# Patient Record
Sex: Female | Born: 1955 | Race: White | Hispanic: No | Marital: Married | Smoking: Never smoker
Health system: Southern US, Community
[De-identification: ages and names within clinical notes are randomized; demographics above are authoritative.]

## PROBLEM LIST (undated history)

## (undated) DIAGNOSIS — K5792 Diverticulitis of intestine, part unspecified, without perforation or abscess without bleeding: Secondary | ICD-10-CM

## (undated) DIAGNOSIS — C52 Malignant neoplasm of vagina: Secondary | ICD-10-CM

## (undated) DIAGNOSIS — R87612 Low grade squamous intraepithelial lesion on cytologic smear of cervix (LGSIL): Secondary | ICD-10-CM

## (undated) HISTORY — PX: DILATION AND CURETTAGE OF UTERUS: SHX78

## (undated) HISTORY — PX: ABDOMINAL HYSTERECTOMY: SHX81

## (undated) HISTORY — PX: CHOLECYSTECTOMY OPEN: SUR202

## (undated) HISTORY — DX: Malignant neoplasm of vagina: C52

## (undated) HISTORY — DX: Diverticulitis of intestine, part unspecified, without perforation or abscess without bleeding: K57.92

## (undated) HISTORY — PX: VEIN SURGERY: SHX48

## (undated) HISTORY — PX: HYSTERECTOMY ABDOMINAL WITH SALPINGECTOMY: SHX6725

## (undated) HISTORY — PX: TONSILLECTOMY: SUR1361

## (undated) HISTORY — PX: COLONOSCOPY: SHX174

## (undated) HISTORY — DX: Low grade squamous intraepithelial lesion on cytologic smear of cervix (LGSIL): R87.612

---

## 2016-07-29 LAB — HM PAP SMEAR: HM Pap smear: NEGATIVE

## 2016-07-30 LAB — HM MAMMOGRAPHY

## 2017-08-11 ENCOUNTER — Encounter: Payer: Self-pay | Admitting: Obstetrics & Gynecology

## 2017-08-11 ENCOUNTER — Ambulatory Visit (INDEPENDENT_AMBULATORY_CARE_PROVIDER_SITE_OTHER): Payer: 59 | Admitting: Obstetrics & Gynecology

## 2017-08-11 VITALS — BP 130/80 | HR 79 | Ht 63.0 in | Wt 130.0 lb

## 2017-08-11 DIAGNOSIS — Z1272 Encounter for screening for malignant neoplasm of vagina: Secondary | ICD-10-CM | POA: Diagnosis not present

## 2017-08-11 DIAGNOSIS — Z1231 Encounter for screening mammogram for malignant neoplasm of breast: Secondary | ICD-10-CM | POA: Diagnosis not present

## 2017-08-11 DIAGNOSIS — Z1211 Encounter for screening for malignant neoplasm of colon: Secondary | ICD-10-CM | POA: Diagnosis not present

## 2017-08-11 DIAGNOSIS — Z01419 Encounter for gynecological examination (general) (routine) without abnormal findings: Secondary | ICD-10-CM

## 2017-08-11 DIAGNOSIS — Z1239 Encounter for other screening for malignant neoplasm of breast: Secondary | ICD-10-CM

## 2017-08-11 DIAGNOSIS — Z0000 Encounter for general adult medical examination without abnormal findings: Secondary | ICD-10-CM

## 2017-08-11 NOTE — Patient Instructions (Signed)
PAP every three years Mammogram every year    Call [place] to schedule Colonoscopy every 5 years Labs next year

## 2017-08-11 NOTE — Progress Notes (Signed)
HPI:      Amy Saunders is a 61 y.o. [id number] who LMP was in the past, she presents today for her annual examination.  The patient has no complaints today. The patient is sexually active. 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  The patient does perform self breast exams.  There is no notable family history of breast or ovarian cancer in her family. The patient is not taking hormone replacement therapy. Patient denies post-menopausal vaginal bleeding.   The patient has regular exercise: yes. The patient denies current symptoms of depression.    GYN Hx: Last Colonoscopy:3 years ago. Normal.  Last DEXA: 3 years ago.    PMHx: Past Medical History:  Diagnosis Date  . Diverticulitis   . Pap smear abnormality of cervix with LGSIL   . 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 ABDOMINAL HYSTERECTOMY    . CHOLECYSTECTOMY OPEN    . DILATION AND CURETTAGE OF UTERUS    . TONSILLECTOMY    . VEIN SURGERY     Family History  Problem Relation Age of Onset  . Heart disease Father   . Lung cancer Father    Social History   Tobacco Use  . Smoking status: Never Smoker  . Smokeless tobacco: Never Used  Substance Use Topics  . Alcohol use: No    Frequency: Never  . Drug use: No   No current outpatient medications on file. Allergies: Patient has no known allergies.  Review of Systems  Constitutional: Negative for chills, fever and malaise/fatigue.  HENT: Negative for congestion, sinus pain and sore throat.   Eyes: Negative for blurred vision and pain.  Respiratory: Negative for cough and wheezing.   Cardiovascular: Negative for chest pain and leg swelling.  Gastrointestinal: Negative for abdominal pain, constipation, diarrhea, heartburn, nausea and vomiting.  Genitourinary:  Negative for dysuria, frequency, hematuria and urgency.  Musculoskeletal: Negative for back pain, joint pain, myalgias and neck pain.  Skin: Negative for itching and rash.  Neurological: Negative for dizziness, tremors and weakness.  Endo/Heme/Allergies: Does not bruise/bleed easily.  Psychiatric/Behavioral: Negative for depression. The patient is not nervous/anxious and does not have insomnia.     Objective: BP 130/80   Pulse 79   Ht 5\' 3"  (1.6 m)   Wt 130 lb (59 kg)   BMI 23.03 kg/m   Filed Weights   08/11/17 0804  Weight: 130 lb (59 kg)   Body mass index is 23.03 kg/m. Physical Exam  Constitutional: She is oriented to person, place, and time. She appears well-developed and well-nourished. No distress.  Genitourinary: Rectum normal and vagina normal. Pelvic exam was performed with patient supine. There is no rash or lesion on the right labia. There is no rash or lesion on the left labia. Vagina exhibits no lesion. No bleeding in the vagina. Right adnexum does not display mass and does not display tenderness. Left adnexum does not display mass and does not display tenderness.  Genitourinary Comments: Absent Uterus Absent cervix Vaginal cuff well healed  HENT:  Head: Normocephalic and atraumatic. Head is without laceration.  Right Ear: Hearing normal.  Left Ear: Hearing normal.  Nose: No epistaxis.  No foreign bodies.  Mouth/Throat: Uvula is midline, oropharynx is clear and moist and mucous membranes are normal.  Eyes: Pupils are  equal, round, and reactive to light.  Neck: Normal range of motion. Neck supple. No thyromegaly present.  Cardiovascular: Normal rate and regular rhythm. Exam reveals no gallop and no friction rub.  No murmur heard. Pulmonary/Chest: Effort normal and breath sounds normal. No respiratory distress. She has no wheezes. Right breast exhibits no mass, no skin change and no tenderness. Left breast exhibits no mass, no skin change and no tenderness.  Abdominal:  Soft. Bowel sounds are normal. She exhibits no distension. There is no tenderness. There is no rebound.  Musculoskeletal: Normal range of motion.  Neurological: She is alert and oriented to person, place, and time. No cranial nerve deficit.  Skin: Skin is warm and dry.  Psychiatric: She has a normal mood and affect. Judgment normal.  Vitals reviewed.  Assessment: Annual Exam 1. Annual physical exam   2. Screening for breast cancer   3. Screening for vaginal cancer   4. Screen for colon cancer    Plan:            1.  Cervical Screening-  Pap smear done today, Pap smear schedule reviewed with patient, vaginal PAP due to h/o VAIN (last few have been normal).  2. Breast screening- Exam annually and mammogram scheduled  3. Colonoscopy every 5 years per GI due 2020, Hemoccult testing yearly  4. Labs normal last year; repeat 2019  5. Counseling for hormonal therapy: none, no change in therapy today     F/U  Return in about 1 year (around 08/11/2018) for Annual.  Amy Applebaum, MD, [facility], [facility] 08/11/2017  8:21 AM

## 2017-08-12 LAB — PAP IG (IMAGE GUIDED): PAP Smear Comment: 0

## 2017-08-19 LAB — HM MAMMOGRAPHY

## 2017-08-29 ENCOUNTER — Encounter: Payer: Self-pay | Admitting: Obstetrics & Gynecology

## 2017-09-04 LAB — FECAL OCCULT BLOOD, IMMUNOCHEMICAL: FECAL OCCULT BLD: NEGATIVE

## 2017-11-15 ENCOUNTER — Telehealth: Payer: Self-pay

## 2017-11-15 NOTE — Telephone Encounter (Signed)
Pt states she had mammo done 08/19/2017 at 8:15am across from [facility] not sure of the name. The mammogram was normal.

## 2018-02-27 DIAGNOSIS — D239 Other benign neoplasm of skin, unspecified: Secondary | ICD-10-CM

## 2018-02-27 HISTORY — DX: Other benign neoplasm of skin, unspecified: D23.9

## 2018-08-17 ENCOUNTER — Ambulatory Visit (INDEPENDENT_AMBULATORY_CARE_PROVIDER_SITE_OTHER): Payer: 59 | Admitting: Obstetrics & Gynecology

## 2018-08-17 ENCOUNTER — Encounter: Payer: Self-pay | Admitting: Obstetrics & Gynecology

## 2018-08-17 VITALS — BP 140/80 | HR 59 | Ht 63.0 in | Wt 125.0 lb

## 2018-08-17 DIAGNOSIS — N952 Postmenopausal atrophic vaginitis: Secondary | ICD-10-CM

## 2018-08-17 DIAGNOSIS — Z1239 Encounter for other screening for malignant neoplasm of breast: Secondary | ICD-10-CM

## 2018-08-17 DIAGNOSIS — Z01419 Encounter for gynecological examination (general) (routine) without abnormal findings: Secondary | ICD-10-CM | POA: Diagnosis not present

## 2018-08-17 DIAGNOSIS — Z1211 Encounter for screening for malignant neoplasm of colon: Secondary | ICD-10-CM

## 2018-08-17 DIAGNOSIS — Z0000 Encounter for general adult medical examination without abnormal findings: Secondary | ICD-10-CM

## 2018-08-17 NOTE — Progress Notes (Signed)
HPI:      Amy Saunders is a 62 y.o. [id number] who LMP was in the past (hysterectomy), she presents today for her annual examination.  The patient has no complaints today. The patient is sexually active w occas spotting ad=fter sex. Herlast pap: approximate date 2018 and was normal and has had prior VAIN years ago and last mammogram: approximate date 2018 and was normal.  The patient does perform self breast exams.  There is no notable family history of breast or ovarian cancer in her family. The patient is not taking hormone replacement therapy. Patient denies post-menopausal vaginal bleeding.   The patient has regular exercise: yes. The patient denies current symptoms of depression.    GYN Hx: Last Colonoscopy:4 years ago. Normal. Due every 5    PMHx: Past Medical History:  Diagnosis Date  . Diverticulitis   . Pap smear abnormality of cervix with LGSIL   . 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 ABDOMINAL HYSTERECTOMY    . CHOLECYSTECTOMY OPEN    . COLONOSCOPY    . DILATION AND CURETTAGE OF UTERUS    . HYSTERECTOMY ABDOMINAL WITH SALPINGECTOMY    . TONSILLECTOMY    . VEIN SURGERY     Family History  Problem Relation Age of Onset  . Heart disease Father   . Lung cancer Father    Social History   Tobacco Use  . Smoking status: Never Smoker  . Smokeless tobacco: Never Used  Substance Use Topics  . Alcohol use: No    Frequency: Never  . Drug use: No   No current outpatient medications on file. Allergies: Patient has no known allergies.  Review of Systems  Constitutional: Negative for chills, fever and malaise/fatigue.  HENT: Negative for congestion, sinus pain and sore throat.   Eyes: Negative for blurred vision and pain.  Respiratory: Negative for cough and wheezing.   Cardiovascular: Negative for chest pain and leg swelling.  Gastrointestinal: Negative for abdominal pain, constipation, diarrhea, heartburn, nausea  and vomiting.  Genitourinary: Negative for dysuria, frequency, hematuria and urgency.  Musculoskeletal: Negative for back pain, joint pain, myalgias and neck pain.  Skin: Negative for itching and rash.  Neurological: Negative for dizziness, tremors and weakness.  Endo/Heme/Allergies: Does not bruise/bleed easily.  Psychiatric/Behavioral: Negative for depression. The patient is not nervous/anxious and does not have insomnia.     Objective: BP 140/80   Pulse (!) 59   Ht 5\' 3"  (1.6 m)   Wt 125 lb (56.7 kg)   BMI 22.14 kg/m   Filed Weights   08/17/18 0914  Weight: 125 lb (56.7 kg)   Body mass index is 22.14 kg/m. Physical Exam  Constitutional: She is oriented to person, place, and time. She appears well-developed and well-nourished. No distress.  Genitourinary: Rectum normal and vagina normal. Pelvic exam was performed with patient supine. There is no rash or lesion on the right labia. There is no rash or lesion on the left labia. Vagina exhibits no lesion. No bleeding in the vagina. Right adnexum does not display mass and does not display tenderness. Left adnexum does not display mass and does not display tenderness.  Genitourinary Comments: Absent Uterus Absent cervix Vaginal cuff well healed  HENT:  Head: Normocephalic and atraumatic. Head is without laceration.  Right Ear: Hearing normal.  Left Ear: Hearing normal.  Nose: No epistaxis.  No foreign bodies.  Mouth/Throat: Uvula is midline, oropharynx is clear and moist and  mucous membranes are normal.  Eyes: Pupils are equal, round, and reactive to light.  Neck: Normal range of motion. Neck supple. No thyromegaly present.  Cardiovascular: Normal rate and regular rhythm. Exam reveals no gallop and no friction rub.  No murmur heard. Pulmonary/Chest: Effort normal and breath sounds normal. No respiratory distress. She has no wheezes. Right breast exhibits no mass, no skin change and no tenderness. Left breast exhibits no mass, no skin  change and no tenderness.  Abdominal: Soft. Bowel sounds are normal. She exhibits no distension. There is no tenderness. There is no rebound.  Musculoskeletal: Normal range of motion.  Neurological: She is alert and oriented to person, place, and time. No cranial nerve deficit.  Skin: Skin is warm and dry.  Psychiatric: She has a normal mood and affect. Judgment normal.  Vitals reviewed.  Assessment:  1. Annual physical exam   2. Screen for colon cancer   3. Screening for breast cancer   4. Vaginal atrophy    Plan:            1.  Vaginal Screening-  Pap smear schedule reviewed with patient  2. Breast screening- Exam annually and mammogram scheduled  3. Colonoscopy every 10 years, Hemoccult testing after age 51  4. Labs due next year  5. Counseling for hormonal therapy: no change in therapy today Replens discussed as option for vaginal atrophy Alternatives such as vag ERT also discussed     F/U  Return in about 1 year (around 08/18/2019) for Annual.  Barnett Applebaum, MD, [facility], [facility] 08/17/2018  1:31 PM

## 2018-08-19 ENCOUNTER — Other Ambulatory Visit: Payer: Self-pay | Admitting: Obstetrics & Gynecology

## 2018-09-06 LAB — FECAL OCCULT BLOOD, IMMUNOCHEMICAL: FECAL OCCULT BLD: NEGATIVE

## 2018-10-19 ENCOUNTER — Ambulatory Visit
Admission: RE | Admit: 2018-10-19 | Discharge: 2018-10-19 | Disposition: A | Discharge status: Home / Self Care | Payer: BLUE CROSS/BLUE SHIELD | Source: Ambulatory Visit | Attending: Obstetrics & Gynecology | Admitting: Obstetrics & Gynecology

## 2018-10-19 DIAGNOSIS — Z1231 Encounter for screening mammogram for malignant neoplasm of breast: Secondary | ICD-10-CM | POA: Diagnosis not present

## 2018-10-19 DIAGNOSIS — Z1239 Encounter for other screening for malignant neoplasm of breast: Secondary | ICD-10-CM

## 2019-04-03 DIAGNOSIS — L578 Other skin changes due to chronic exposure to nonionizing radiation: Secondary | ICD-10-CM | POA: Diagnosis not present

## 2019-04-03 DIAGNOSIS — D485 Neoplasm of uncertain behavior of skin: Secondary | ICD-10-CM | POA: Diagnosis not present

## 2019-04-03 DIAGNOSIS — L820 Inflamed seborrheic keratosis: Secondary | ICD-10-CM | POA: Diagnosis not present

## 2019-04-03 DIAGNOSIS — D229 Melanocytic nevi, unspecified: Secondary | ICD-10-CM | POA: Diagnosis not present

## 2019-04-03 DIAGNOSIS — Z1283 Encounter for screening for malignant neoplasm of skin: Secondary | ICD-10-CM | POA: Diagnosis not present

## 2019-06-29 ENCOUNTER — Other Ambulatory Visit: Payer: Self-pay

## 2019-06-29 DIAGNOSIS — Z20822 Contact with and (suspected) exposure to covid-19: Secondary | ICD-10-CM

## 2019-07-01 ENCOUNTER — Telehealth: Payer: Self-pay

## 2019-07-01 LAB — NOVEL CORONAVIRUS, NAA: SARS-CoV-2, NAA: NOT DETECTED

## 2019-07-01 NOTE — Telephone Encounter (Signed)
Pt called for test results, advised that Covid results are not back yet. Pt is active in [place].

## 2019-07-20 ENCOUNTER — Other Ambulatory Visit: Payer: Self-pay

## 2019-07-20 DIAGNOSIS — Z20822 Contact with and (suspected) exposure to covid-19: Secondary | ICD-10-CM

## 2019-07-21 ENCOUNTER — Telehealth: Payer: Self-pay

## 2019-07-21 LAB — NOVEL CORONAVIRUS, NAA: SARS-CoV-2, NAA: NOT DETECTED

## 2019-07-21 NOTE — Telephone Encounter (Signed)
Pt called for test results advised results are not back.

## 2019-07-21 NOTE — Telephone Encounter (Signed)
Pt called for test results pt advised that test reults are not back

## 2019-10-16 DIAGNOSIS — L821 Other seborrheic keratosis: Secondary | ICD-10-CM | POA: Diagnosis not present

## 2019-10-16 DIAGNOSIS — D2262 Melanocytic nevi of left upper limb, including shoulder: Secondary | ICD-10-CM | POA: Diagnosis not present

## 2019-10-16 DIAGNOSIS — Z1283 Encounter for screening for malignant neoplasm of skin: Secondary | ICD-10-CM | POA: Diagnosis not present

## 2019-10-16 DIAGNOSIS — D225 Melanocytic nevi of trunk: Secondary | ICD-10-CM | POA: Diagnosis not present

## 2019-11-23 ENCOUNTER — Other Ambulatory Visit: Payer: Self-pay

## 2019-11-23 ENCOUNTER — Encounter: Payer: Self-pay | Admitting: Obstetrics & Gynecology

## 2019-11-23 ENCOUNTER — Ambulatory Visit (INDEPENDENT_AMBULATORY_CARE_PROVIDER_SITE_OTHER): Payer: BC Managed Care – PPO | Admitting: Obstetrics & Gynecology

## 2019-11-23 VITALS — BP 130/80 | Ht 63.0 in | Wt 125.0 lb

## 2019-11-23 DIAGNOSIS — Z90710 Acquired absence of both cervix and uterus: Secondary | ICD-10-CM | POA: Diagnosis not present

## 2019-11-23 DIAGNOSIS — Z1321 Encounter for screening for nutritional disorder: Secondary | ICD-10-CM

## 2019-11-23 DIAGNOSIS — N952 Postmenopausal atrophic vaginitis: Secondary | ICD-10-CM | POA: Diagnosis not present

## 2019-11-23 DIAGNOSIS — Z01419 Encounter for gynecological examination (general) (routine) without abnormal findings: Secondary | ICD-10-CM | POA: Diagnosis not present

## 2019-11-23 DIAGNOSIS — F419 Anxiety disorder, unspecified: Secondary | ICD-10-CM | POA: Diagnosis not present

## 2019-11-23 DIAGNOSIS — Z1231 Encounter for screening mammogram for malignant neoplasm of breast: Secondary | ICD-10-CM

## 2019-11-23 DIAGNOSIS — Z1329 Encounter for screening for other suspected endocrine disorder: Secondary | ICD-10-CM

## 2019-11-23 DIAGNOSIS — Z13220 Encounter for screening for lipoid disorders: Secondary | ICD-10-CM | POA: Diagnosis not present

## 2019-11-23 DIAGNOSIS — Z131 Encounter for screening for diabetes mellitus: Secondary | ICD-10-CM | POA: Diagnosis not present

## 2019-11-23 DIAGNOSIS — Z1211 Encounter for screening for malignant neoplasm of colon: Secondary | ICD-10-CM

## 2019-11-23 MED ORDER — REPLENS VA GEL: 1.0000 | VAGINAL | 11 refills | Status: DC

## 2019-11-23 MED ORDER — ALPRAZOLAM 0.25 MG PO TABS: 0.2500 mg | ORAL_TABLET | Freq: Every day | ORAL | 0 refills | Status: DC | PRN

## 2019-11-23 MED ORDER — NA SULFATE-K SULFATE-MG SULF 17.5-3.13-1.6 GM/177ML PO SOLN: 354.0000 mL | Freq: Once | ORAL | 0 refills | Status: AC

## 2019-11-23 NOTE — Patient Instructions (Signed)
Atrophic Vaginitis  Atrophic vaginitis is a condition in which the tissues that line the vagina become dry and thin. This condition is most common in women who have stopped having regular menstrual periods (are in menopause). This usually starts when a woman is 45-65 years old. That is the time when a woman's estrogen levels begin to drop (decrease). Estrogen is a female hormone. It helps to keep the tissues of the vagina moist. It stimulates the vagina to produce a clear fluid that lubricates the vagina for sexual intercourse. This fluid also protects the vagina from infection. Lack of estrogen can cause the lining of the vagina to get thinner and dryer. The vagina may also shrink in size. It may become less elastic. Atrophic vaginitis tends to get worse over time as a woman's estrogen level drops. What are the causes? This condition is caused by the normal drop in estrogen that happens around the time of menopause. What increases the risk? Certain conditions or situations may lower a woman's estrogen level, leading to a higher risk for atrophic vaginitis. You are more likely to develop this condition if:  You are taking medicines that block estrogen.  You have had your ovaries removed.  You are being treated for cancer with X-ray (radiation) or medicines (chemotherapy).  You have given birth or are breastfeeding.  You are older than age 50.  You smoke. What are the signs or symptoms? Symptoms of this condition include:  Pain, soreness, or bleeding during sexual intercourse (dyspareunia).  Vaginal burning, irritation, or itching.  Pain or bleeding when a speculum is used in a vaginal exam (pelvic exam).  Having burning pain when passing urine.  Vaginal discharge that is brown or yellow. In some cases, there are no symptoms. How is this diagnosed? This condition is diagnosed by taking a medical history and doing a physical exam. This will include a pelvic exam that checks the  vaginal tissues. Though rare, you may also have other tests, including:  A urine test.  A test that checks the acid balance in your vagina (acid balance test). How is this treated? Treatment for this condition depends on how severe your symptoms are. Treatment may include:  Using an over-the-counter vaginal lubricant before sex.  Using a long-acting vaginal moisturizer.  REPLENS  Using low-dose vaginal estrogen for moderate to severe symptoms that do not respond to other treatments. Options include creams, tablets, and inserts (vaginal rings). Before you use a vaginal estrogen, tell your health care provider if you have a history of: ? Breast cancer. ? Endometrial cancer. ? Blood clots. If you are not sexually active and your symptoms are very mild, you may not need treatment. Follow these instructions at home: Medicines  Take over-the-counter and prescription medicines only as told by your health care provider. Do not use herbal or alternative medicines unless your health care provider says that you can.  Use over-the-counter creams, lubricants, or moisturizers for dryness only as directed by your health care provider. General instructions  If your atrophic vaginitis is caused by menopause, discuss all of your menopause symptoms and treatment options with your health care provider.  Do not douche.  Do not use products that can make your vagina dry. These include: ? Scented feminine sprays. ? Scented tampons. ? Scented soaps.  Vaginal intercourse can help to improve blood flow and elasticity of vaginal tissue. If it hurts to have sex, try using a lubricant or moisturizer just before having intercourse. Contact a health care provider   Your discharge looks different than normal.  Your vagina has an unusual smell.  You have new symptoms.  Your symptoms do not improve with treatment.  Your symptoms get worse. Summary  Atrophic vaginitis is a condition in which the  tissues that line the vagina become dry and thin. It is most common in women who have stopped having regular menstrual periods (are in menopause).  Treatment options include using vaginal lubricants and low-dose vaginal estrogen.  Contact a health care provider if your vagina has an unusual smell, or if your symptoms get worse or do not improve after treatment. This information is not intended to replace advice given to you by your health care provider. Make sure you discuss any questions you have with your health care provider. Document Revised: 08/12/2017 Document Reviewed: 05/26/2017 Elsevier Patient Education  2020 Elsevier Inc.  

## 2019-11-23 NOTE — Progress Notes (Signed)
HPI:      Ms. ANGLIE SANDLER is a 64 y.o. [id number] who LMP was in the past, she presents today for her annual examination.  The patient has no complaints today other than Vaginal Dryness and Dyspareunia, Some urinary urgency (no freq or nocturia or LOU), and more recent anxiety mostly related to stressful events at home and w spouse. The patient is sexually active. Herlast pap: approximate date 2018 (vag PAP, prior hysterectomy; prior VAIN many years ago) and was normal and last mammogram: approximate date 2019 and was normal.  The patient does perform self breast exams.  There is no notable family history of breast or ovarian cancer in her family. The patient is not taking hormone replacement therapy. Patient denies post-menopausal vaginal bleeding.   The patient has regular exercise: yes. The patient denies current symptoms of depression.    GYN Hx: Last Colonoscopy:5 years ago. Normal.    PMHx: Past Medical History:  Diagnosis Date  . Diverticulitis   . Pap smear abnormality of cervix with LGSIL   . 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 ABDOMINAL HYSTERECTOMY    . CHOLECYSTECTOMY OPEN    . COLONOSCOPY    . DILATION AND CURETTAGE OF UTERUS    . HYSTERECTOMY ABDOMINAL WITH SALPINGECTOMY    . TONSILLECTOMY    . VEIN SURGERY     Family History  Problem Relation Age of Onset  . Heart disease Father   . Lung cancer Father   . Breast cancer Neg Hx    Social History   Tobacco Use  . Smoking status: Never Smoker  . Smokeless tobacco: Never Used  Substance Use Topics  . Alcohol use: No  . Drug use: No    Current Outpatient Medications:  .  ALPRAZolam (XANAX) 0.25 MG tablet, Take 1 tablet (0.25 mg total) by mouth daily as needed for anxiety., Disp: 20 tablet, Rfl: 0 .  [START ON 11/26/2019] Vaginal Lubricant (REPLENS) GEL, Place 1 Applicatorful vaginally 2 (two) times a week., Disp: 35 g, Rfl: 11 Allergies: Patient has no known  allergies.  Review of Systems  Constitutional: Negative for chills, fever and malaise/fatigue.  HENT: Negative for congestion, sinus pain and sore throat.   Eyes: Negative for blurred vision and pain.  Respiratory: Negative for cough and wheezing.   Cardiovascular: Negative for chest pain and leg swelling.  Gastrointestinal: Negative for abdominal pain, constipation, diarrhea, heartburn, nausea and vomiting.  Genitourinary: Positive for urgency. Negative for dysuria, frequency and hematuria.  Musculoskeletal: Negative for back pain, joint pain, myalgias and neck pain.  Skin: Negative for itching and rash.  Neurological: Negative for dizziness, tremors and weakness.  Endo/Heme/Allergies: Does not bruise/bleed easily.  Psychiatric/Behavioral: Negative for depression. The patient is nervous/anxious. The patient does not have insomnia.     Objective: BP 130/80   Ht 5\' 3"  (1.6 m)   Wt 125 lb (56.7 kg)   BMI 22.14 kg/m   Filed Weights   11/23/19 0808  Weight: 125 lb (56.7 kg)   Body mass index is 22.14 kg/m. Physical Exam Constitutional:      General: She is not in acute distress.    Appearance: She is well-developed.  Genitourinary:     Pelvic exam was performed with patient supine.     Vagina and rectum normal.     No lesions in the vagina.     No vaginal bleeding.     No right or left  adnexal mass present.     Right adnexa not tender.     Left adnexa not tender.     Genitourinary Comments: Absent Uterus Absent cervix Vaginal cuff well healed Mild atrophy noted  HENT:     Head: Normocephalic and atraumatic. No laceration.     Right Ear: Hearing normal.     Left Ear: Hearing normal.     Mouth/Throat:     Pharynx: Uvula midline.  Eyes:     Pupils: Pupils are equal, round, and reactive to light.  Neck:     Thyroid: No thyromegaly.  Cardiovascular:     Rate and Rhythm: Normal rate and regular rhythm.     Heart sounds: No murmur. No friction rub. No gallop.    Pulmonary:     Effort: Pulmonary effort is normal. No respiratory distress.     Breath sounds: Normal breath sounds. No wheezing.  Chest:     Breasts:        Right: No mass, skin change or tenderness.        Left: No mass, skin change or tenderness.  Abdominal:     General: Bowel sounds are normal. There is no distension.     Palpations: Abdomen is soft.     Tenderness: There is no abdominal tenderness. There is no rebound.  Musculoskeletal:        General: Normal range of motion.     Cervical back: Normal range of motion and neck supple.  Neurological:     Mental Status: She is alert and oriented to person, place, and time.     Cranial Nerves: No cranial nerve deficit.  Skin:    General: Skin is warm and dry.  Psychiatric:        Judgment: Judgment normal.  Vitals reviewed.     Assessment: Annual Exam 1. Women's annual routine gynecological examination   2. Encounter for screening mammogram for malignant neoplasm of breast   3. Screen for colon cancer   4. Anxiety   5. Vaginal atrophy   6. Screening for thyroid disorder   7. Screening for diabetes mellitus   8. Screening for cholesterol level   9. Encounter for vitamin deficiency screening     Plan:            1.  Vaginal Screening-  Pap smear schedule reviewed with patient, every 5 yrs  2. Breast screening- Exam annually and mammogram scheduled  3. Colonoscopy every 5 years and is due, will schedule consult  4. Labs Ordered today  5. Counseling for hormonal therapy: none Plan Replens for vaginal atrophy, and all other options discussed and considered as future options including vaginal ERT. Plan f/u 3 mos to see how Replens is doing              6. FRAX - FRAX score for assessing the 10 year probability for fracture calculated and discussed today.  Based on age and score today, DEXA is not currently scheduled.   7. Anxiety, external factors mostly.  Counseling offered.  Xanax Rx for short term anxious moments  discussed.  Not for long term use.  May need referral if becomes chronic to psychiatry.    F/U  Return in about 3 months (around 02/23/2020) for Virtual follow up.  Barnett Applebaum, MD, [facility], [facility] 11/23/2019  8:41 AM

## 2019-11-24 LAB — CMP AND LIVER
ALT: 17 IU/L (ref 0–32)
AST: 23 IU/L (ref 0–40)
Albumin: 4.4 g/dL (ref 3.8–4.8)
Alkaline Phosphatase: 95 IU/L (ref 39–117)
BUN: 18 mg/dL (ref 8–27)
Bilirubin Total: 0.4 mg/dL (ref 0.0–1.2)
Bilirubin, Direct: 0.1 mg/dL (ref 0.00–0.40)
CO2: 25 mmol/L (ref 20–29)
Calcium: 10.2 mg/dL (ref 8.7–10.3)
Chloride: 103 mmol/L (ref 96–106)
Creatinine, Ser: 0.86 mg/dL (ref 0.57–1.00)
GFR calc Af Amer: 83 mL/min/{1.73_m2} (ref >59)
GFR calc non Af Amer: 72 mL/min/{1.73_m2} (ref >59)
Glucose, Plasma: 92 mg/dL (ref 65–99)
Potassium: 4.2 mmol/L (ref 3.5–5.2)
Sodium: 142 mmol/L (ref 134–144)
Total Protein: 6.6 g/dL (ref 6.0–8.5)

## 2019-11-24 LAB — CBC WITH DIFFERENTIAL
Basophils Absolute: 0 10*3/uL (ref 0.0–0.2)
Basos: 0 %
EOS (ABSOLUTE): 0.2 10*3/uL (ref 0.0–0.4)
Eos: 3 %
Hematocrit: 46 % (ref 34.0–46.6)
Hemoglobin: 14.9 g/dL (ref 11.1–15.9)
Immature Grans (Abs): 0 10*3/uL (ref 0.0–0.1)
Immature Granulocytes: 0 %
Lymphocytes Absolute: 1.5 10*3/uL (ref 0.7–3.1)
Lymphs: 22 %
MCH: 28.4 pg (ref 26.6–33.0)
MCHC: 32.4 g/dL (ref 31.5–35.7)
MCV: 88 fL (ref 79–97)
Monocytes Absolute: 0.5 10*3/uL (ref 0.1–0.9)
Monocytes: 7 %
Neutrophils Absolute: 4.6 10*3/uL (ref 1.4–7.0)
Neutrophils: 68 %
RBC: 5.24 x10E6/uL (ref 3.77–5.28)
RDW: 12.3 % (ref 11.7–15.4)
WBC: 6.9 10*3/uL (ref 3.4–10.8)

## 2019-11-24 LAB — LIPID PANEL
Chol/HDL Ratio: 2.4 ratio (ref 0.0–4.4)
Cholesterol, Total: 171 mg/dL (ref 100–199)
HDL: 71 mg/dL (ref >39)
LDL Chol Calc (NIH): 87 mg/dL (ref 0–99)
Triglycerides: 67 mg/dL (ref 0–149)
VLDL Cholesterol Cal: 13 mg/dL (ref 5–40)

## 2019-11-24 LAB — VITAMIN D 25 HYDROXY (VIT D DEFICIENCY, FRACTURES): Vit D, 25-Hydroxy: 25.8 ng/mL — ABNORMAL LOW (ref 30.0–100.0)

## 2019-11-24 LAB — TSH: TSH: 1.83 u[IU]/mL (ref 0.450–4.500)

## 2019-12-11 ENCOUNTER — Ambulatory Visit
Admission: RE | Admit: 2019-12-11 | Discharge: 2019-12-11 | Disposition: A | Discharge status: Home / Self Care | Payer: BC Managed Care – PPO | Source: Ambulatory Visit | Attending: Obstetrics & Gynecology | Admitting: Obstetrics & Gynecology

## 2019-12-11 ENCOUNTER — Encounter: Payer: Self-pay | Admitting: Radiology

## 2019-12-11 DIAGNOSIS — Z1231 Encounter for screening mammogram for malignant neoplasm of breast: Secondary | ICD-10-CM

## 2019-12-24 ENCOUNTER — Telehealth: Payer: Self-pay

## 2019-12-24 NOTE — Telephone Encounter (Signed)
Patient aware. Dr. Marius Ditch phone # given [phone].

## 2019-12-24 NOTE — Telephone Encounter (Signed)
Patient left voicemail. Wants to make sure that the doctor codes her procedure as high risk since she's had polyps in the pass. Insurance says they will cover at 100% if coded as high risk. Patient would like a call from nurse or doctor.

## 2019-12-24 NOTE — Telephone Encounter (Signed)
If she had a history of polyps, I will code it as high risk procedure only.  I see the original referral as colon cancer screening which is also preventive and will be covered by her insurance  Thanks RV

## 2019-12-24 NOTE — Telephone Encounter (Signed)
Patient is scheduled for colonoscopy scheduled for 01/04/20 that [facility] ordered. Her insurance states they will cover at 100% if preventative, but if any polyps etc removed, it would apply to her deductible unless it is considered Hifh Risk, then it would be covered at 100%. She reports she has had polyps in the past and hopes that would qualify as High Risk. She would like a call back to clarify so she will know how her insurance may cover this. Cb#[phone]

## 2019-12-24 NOTE — Telephone Encounter (Signed)
Called and left a detail message explaining this

## 2020-01-02 ENCOUNTER — Other Ambulatory Visit: Payer: Self-pay

## 2020-01-02 ENCOUNTER — Other Ambulatory Visit
Admission: RE | Admit: 2020-01-02 | Discharge: 2020-01-02 | Disposition: A | Discharge status: Home / Self Care | Payer: BC Managed Care – PPO | Source: Ambulatory Visit | Attending: Gastroenterology | Admitting: Gastroenterology

## 2020-01-02 DIAGNOSIS — Z20822 Contact with and (suspected) exposure to covid-19: Secondary | ICD-10-CM | POA: Insufficient documentation

## 2020-01-02 DIAGNOSIS — Z01812 Encounter for preprocedural laboratory examination: Secondary | ICD-10-CM | POA: Diagnosis not present

## 2020-01-02 LAB — SARS CORONAVIRUS 2 (TAT 6-24 HRS): SARS Coronavirus 2: NEGATIVE

## 2020-01-04 ENCOUNTER — Ambulatory Visit: Payer: BC Managed Care – PPO | Admitting: Certified Registered"

## 2020-01-04 ENCOUNTER — Ambulatory Visit
Admission: RE | Admit: 2020-01-04 | Discharge: 2020-01-04 | Disposition: A | Discharge status: Home / Self Care | Payer: BC Managed Care – PPO | Attending: Gastroenterology | Admitting: Gastroenterology

## 2020-01-04 ENCOUNTER — Encounter
Admission: RE | Disposition: A | Discharge status: Home / Self Care | Payer: Self-pay | Source: Home / Self Care | Attending: Gastroenterology

## 2020-01-04 ENCOUNTER — Encounter: Payer: Self-pay | Admitting: Gastroenterology

## 2020-01-04 DIAGNOSIS — K5730 Diverticulosis of large intestine without perforation or abscess without bleeding: Secondary | ICD-10-CM | POA: Diagnosis not present

## 2020-01-04 DIAGNOSIS — Z79899 Other long term (current) drug therapy: Secondary | ICD-10-CM | POA: Insufficient documentation

## 2020-01-04 DIAGNOSIS — K635 Polyp of colon: Secondary | ICD-10-CM

## 2020-01-04 DIAGNOSIS — D122 Benign neoplasm of ascending colon: Secondary | ICD-10-CM | POA: Diagnosis not present

## 2020-01-04 DIAGNOSIS — D123 Benign neoplasm of transverse colon: Secondary | ICD-10-CM | POA: Insufficient documentation

## 2020-01-04 DIAGNOSIS — F419 Anxiety disorder, unspecified: Secondary | ICD-10-CM | POA: Diagnosis not present

## 2020-01-04 DIAGNOSIS — Z1211 Encounter for screening for malignant neoplasm of colon: Secondary | ICD-10-CM | POA: Diagnosis not present

## 2020-01-04 HISTORY — PX: COLONOSCOPY WITH PROPOFOL: SHX5780

## 2020-01-04 SURGERY — COLONOSCOPY WITH PROPOFOL
Anesthesia: General

## 2020-01-04 MED ORDER — PROPOFOL 500 MG/50ML IV EMUL
INTRAVENOUS | Status: DC | PRN
  Administered 2020-01-04: 165 ug/kg/min via INTRAVENOUS

## 2020-01-04 MED ORDER — GLYCOPYRROLATE 0.2 MG/ML IJ SOLN
INTRAMUSCULAR | Status: DC | PRN
  Administered 2020-01-04: .2 mg via INTRAVENOUS

## 2020-01-04 MED ORDER — SODIUM CHLORIDE 0.9 % IV SOLN
INTRAVENOUS | Status: DC
  Administered 2020-01-04: 09:00:00 1000 mL via INTRAVENOUS

## 2020-01-04 MED ORDER — LIDOCAINE HCL (CARDIAC) PF 100 MG/5ML IV SOSY
PREFILLED_SYRINGE | INTRAVENOUS | Status: DC | PRN
  Administered 2020-01-04: 100 mg via INTRATRACHEAL

## 2020-01-04 MED ORDER — PROPOFOL 10 MG/ML IV BOLUS
INTRAVENOUS | Status: DC | PRN
  Administered 2020-01-04: 50 mg via INTRAVENOUS

## 2020-01-04 NOTE — Anesthesia Preprocedure Evaluation (Signed)
Anesthesia Evaluation  Patient identified by MRN, date of birth, ID band Patient awake    Reviewed: Allergy & Precautions, H&P , NPO status , Patient's Chart, lab work & pertinent test results, reviewed documented beta blocker date and time   Airway Mallampati: II   Neck ROM: full    Dental  (+) Poor Dentition   Pulmonary neg pulmonary ROS,    Pulmonary exam normal        Cardiovascular negative cardio ROS Normal cardiovascular exam Rhythm:regular Rate:Normal     Neuro/Psych Anxiety negative neurological ROS  negative psych ROS   GI/Hepatic negative GI ROS, Neg liver ROS,   Endo/Other  negative endocrine ROS  Renal/GU negative Renal ROS  negative genitourinary   Musculoskeletal   Abdominal   Peds  Hematology negative hematology ROS (+)   Anesthesia Other Findings Past Medical History: No date: Diverticulitis No date: Pap smear abnormality of cervix with LGSIL No date: Vaginal malignant neoplasm (HCC) Past Surgical History: No date: ABDOMINAL HYSTERECTOMY No date: CHOLECYSTECTOMY OPEN No date: COLONOSCOPY No date: DILATION AND CURETTAGE OF UTERUS No date: HYSTERECTOMY ABDOMINAL WITH SALPINGECTOMY No date: TONSILLECTOMY No date: VEIN SURGERY BMI    Body Mass Index: 21.12 kg/m     Reproductive/Obstetrics negative OB ROS                             Anesthesia Physical Anesthesia Plan  ASA: II  Anesthesia Plan: General   Post-op Pain Management:    Induction:   PONV Risk Score and Plan:   Airway Management Planned:   Additional Equipment:   Intra-op Plan:   Post-operative Plan:   Informed Consent: I have reviewed the patients History and Physical, chart, labs and discussed the procedure including the risks, benefits and alternatives for the proposed anesthesia with the patient or authorized representative who has indicated his/her understanding and acceptance.      Dental Advisory Given  Plan Discussed with: CRNA  Anesthesia Plan Comments:         Anesthesia Quick Evaluation

## 2020-01-04 NOTE — Transfer of Care (Signed)
Immediate Anesthesia Transfer of Care Note  Patient: Aleayah Tham  Procedure(s) Performed: COLONOSCOPY WITH PROPOFOL (N/A )  Patient Location: Endoscopy Unit  Anesthesia Type:General  Level of Consciousness: drowsy, patient cooperative and responds to stimulation  Airway & Oxygen Therapy: Patient Spontanous Breathing and Patient connected to face mask oxygen  Post-op Assessment: Report given to RN and Post -op Vital signs reviewed and stable  Post vital signs: Reviewed and stable  Last Vitals:  Vitals Value Taken Time  BP 136/83 01/04/20 0927  Temp 36.4 C 01/04/20 0927  Pulse 84 01/04/20 0927  Resp 12 01/04/20 0927  SpO2 100 % 01/04/20 0927  Vitals shown include unvalidated device data.  Last Pain:  Vitals:   01/04/20 0927  TempSrc:   PainSc: Asleep         Complications: No apparent anesthesia complications

## 2020-01-04 NOTE — H&P (Signed)
[name], MD [address]  [place], [place] [address]  Main: [phone]  Fax: [phone] Pager: [phone]  Primary Care Physician:  Gae Dry, MD Primary Gastroenterologist:  Dr. [name]  Pre-Procedure History & Physical: HPI:  Amy Saunders is a 64 y.o. female is here for an colonoscopy.   Past Medical History:  Diagnosis Date  . Diverticulitis   . Pap smear abnormality of cervix with LGSIL   . Vaginal malignant neoplasm [facility])     Past Surgical History:  Procedure Laterality Date  . ABDOMINAL HYSTERECTOMY    . CHOLECYSTECTOMY OPEN    . COLONOSCOPY    . DILATION AND CURETTAGE OF UTERUS    . HYSTERECTOMY ABDOMINAL WITH SALPINGECTOMY    . TONSILLECTOMY    . VEIN SURGERY      Prior to Admission medications   Medication Sig Start Date End Date Taking? Authorizing Provider  ALPRAZolam (XANAX) 0.25 MG tablet Take 1 tablet (0.25 mg total) by mouth daily as needed for anxiety. 11/23/19  Yes Gae Dry, MD  Vaginal Lubricant (REPLENS) GEL Place 1 Applicatorful vaginally 2 (two) times a week. 11/26/19   Gae Dry, MD    Allergies as of 11/23/2019  . (No Known Allergies)    Family History  Problem Relation Age of Onset  . Heart disease Father   . Lung cancer Father   . Breast cancer Neg Hx     Social History   Socioeconomic History  . Marital status: Married    Spouse name: Not on file  . Number of children: Not on file  . Years of education: Not on file  . Highest education level: Not on file  Occupational History  . Not on file  Tobacco Use  . Smoking status: Never Smoker  . Smokeless tobacco: Never Used  Substance and Sexual Activity  . Alcohol use: No  . Drug use: No  . Sexual activity: Yes  Other Topics Concern  . Not on file  Social History Narrative  . Not on file   Social Determinants of Health   Financial Resource Strain:   . Difficulty of Paying Living Expenses:   Food Insecurity:   .  Worried About Charity fundraiser in the Last Year:   . Arboriculturist in the Last Year:   Transportation Needs:   . Film/video editor (Medical):   Marland Kitchen Lack of Transportation (Non-Medical):   Physical Activity:   . Days of Exercise per Week:   . Minutes of Exercise per Session:   Stress:   . Feeling of Stress :   Social Connections:   . Frequency of Communication with Friends and Family:   . Frequency of Social Gatherings with Friends and Family:   . Attends Religious Services:   . Active Member of Clubs or Organizations:   . Attends Archivist Meetings:   Marland Kitchen Marital Status:   Intimate Partner Violence:   . Fear of Current or Ex-Partner:   . Emotionally Abused:   Marland Kitchen Physically Abused:   . Sexually Abused:     Review of Systems: See HPI, otherwise negative ROS  Physical Exam: BP (!) 142/78   Pulse 62   Temp (!) 96.7 F (35.9 C) (Temporal)   Resp 16   Ht 5\' 3"  (1.6 m)   Wt 54.1 kg   SpO2 100%   BMI 21.12 kg/m  General:   Alert,  pleasant and cooperative in NAD  Head:  Normocephalic and atraumatic. Neck:  Supple; no masses or thyromegaly. Lungs:  Clear throughout to auscultation.    Heart:  Regular rate and rhythm. Abdomen:  Soft, nontender and nondistended. Normal bowel sounds, without guarding, and without rebound.   Neurologic:  Alert and  oriented x4;  grossly normal neurologically.  Impression/Plan: Amy Saunders is here for an colonoscopy to be performed for colon cancer screening  Risks, benefits, limitations, and alternatives regarding  colonoscopy have been reviewed with the patient.  Questions have been answered.  All parties agreeable.   Sherri Sear, MD  01/04/2020, 8:48 AM

## 2020-01-04 NOTE — Op Note (Signed)
[facility] Gastroenterology Patient Name: Amy Saunders Procedure Date: 01/04/2020 8:55 AM MRN: [id number] Account #: [id number] Date of Birth: 29-Nov-1955 Admit Type: Outpatient Age: 64 Room: [facility] ENDO ROOM 3 Gender: Female Note Status: Finalized Procedure:             Colonoscopy Indications:           Screening for colorectal malignant neoplasm Providers:             Lin Landsman MD, MD Referring MD:          Loney Hering (Referring MD) Medicines:             Monitored Anesthesia Care Complications:         No immediate complications. Estimated blood loss: None. Procedure:             Pre-Anesthesia Assessment:                        - Prior to the procedure, a History and Physical was                         performed, and patient medications and allergies were                         reviewed. The patient is competent. The risks and                         benefits of the procedure and the sedation options and                         risks were discussed with the patient. All questions                         were answered and informed consent was obtained.                         Patient identification and proposed procedure were                         verified by the physician, the nurse, the                         anesthesiologist, the anesthetist and the technician                         in the pre-procedure area in the procedure room in the                         endoscopy suite. Mental Status Examination: alert and                         oriented. Airway Examination: normal oropharyngeal                         airway and neck mobility. Respiratory Examination:                         clear to auscultation. CV Examination: normal.  Prophylactic Antibiotics: The patient does not require                         prophylactic antibiotics. Prior Anticoagulants: The                         patient has taken no  previous anticoagulant or                         antiplatelet agents. ASA Grade Assessment: II - A                         patient with mild systemic disease. After reviewing                         the risks and benefits, the patient was deemed in                         satisfactory condition to undergo the procedure. The                         anesthesia plan was to use monitored anesthesia care                         (MAC). Immediately prior to administration of                         medications, the patient was re-assessed for adequacy                         to receive sedatives. The heart rate, respiratory                         rate, oxygen saturations, blood pressure, adequacy of                         pulmonary ventilation, and response to care were                         monitored throughout the procedure. The physical                         status of the patient was re-assessed after the                         procedure.                        After obtaining informed consent, the colonoscope was                         passed under direct vision. Throughout the procedure,                         the patient's blood pressure, pulse, and oxygen                         saturations were monitored continuously. The  Colonoscope was introduced through the anus and                         advanced to the the terminal ileum, with                         identification of the appendiceal orifice and IC                         valve. The colonoscopy was performed without                         difficulty. The patient tolerated the procedure well.                         The quality of the bowel preparation was evaluated                         using the BBPS [facility] Bowel Preparation Scale) with                         scores of: Right Colon = 3, Transverse Colon = 3 and                         Left Colon = 3 (entire mucosa seen well with no                          residual staining, small fragments of stool or opaque                         liquid). The total BBPS score equals 9. Findings:      The perianal and digital rectal examinations were normal. Pertinent       negatives include normal sphincter tone and no palpable rectal lesions.      The terminal ileum appeared normal.      Two sessile polyps were found in the transverse colon and ascending       colon. The polyps were 5 to 7 mm in size. These polyps were removed with       a cold snare. Resection and retrieval were complete.      Multiple diverticula were found in the sigmoid colon.      The retroflexed view of the distal rectum and anal verge was normal and       showed no anal or rectal abnormalities. Impression:            - The examined portion of the ileum was normal.                        - Two 5 to 7 mm polyps in the transverse colon and in                         the ascending colon, removed with a cold snare.                         Resected and retrieved.                        -  Diverticulosis in the sigmoid colon.                        - The distal rectum and anal verge are normal on                         retroflexion view. Recommendation:        - Discharge patient to home (with escort).                        - Resume previous diet today.                        - Continue present medications.                        - Await pathology results.                        - Repeat colonoscopy in 5 years for surveillance. Procedure Code(s):     --- Professional ---                        [phone], Colonoscopy, flexible; with removal of                         tumor(s), polyp(s), or other lesion(s) by snare                         technique Diagnosis Code(s):     --- Professional ---                        Z12.11, Encounter for screening for malignant neoplasm                         of colon                        K63.5, Polyp of colon                        K57.30,  Diverticulosis of large intestine without                         perforation or abscess without bleeding CPT copyright 2019 American Medical Association. All rights reserved. The codes documented in this report are preliminary and upon coder review may  be revised to meet current compliance requirements. Dr. Ulyess Mort Lin Landsman MD, MD 01/04/2020 9:27:29 AM This report has been signed electronically. Number of Addenda: 0 Note Initiated On: 01/04/2020 8:55 AM Scope Withdrawal Time: 0 hours 15 minutes 46 seconds  Total Procedure Duration: 0 hours 22 minutes 7 seconds  Estimated Blood Loss:  Estimated blood loss: none.      [facility]

## 2020-01-06 NOTE — Progress Notes (Signed)
Voicemail.  No Message Left. 

## 2020-01-07 ENCOUNTER — Encounter: Payer: Self-pay | Admitting: Gastroenterology

## 2020-01-07 LAB — SURGICAL PATHOLOGY

## 2020-01-14 NOTE — Anesthesia Postprocedure Evaluation (Signed)
Anesthesia Post Note  Patient: Amy Saunders  Procedure(s) Performed: COLONOSCOPY WITH PROPOFOL (N/A )  Patient location during evaluation: PACU Anesthesia Type: General Level of consciousness: awake and alert Pain management: pain level controlled Vital Signs Assessment: post-procedure vital signs reviewed and stable Respiratory status: spontaneous breathing, nonlabored ventilation, respiratory function stable and patient connected to nasal cannula oxygen Cardiovascular status: blood pressure returned to baseline and stable Postop Assessment: no apparent nausea or vomiting Anesthetic complications: no     Last Vitals:  Vitals:   01/04/20 0937 01/04/20 0947  BP: 127/79 138/87  Pulse: 73   Resp: 11   Temp:    SpO2: 99%     Last Pain:  Vitals:   01/06/20 0944  TempSrc:   PainSc: 0-No pain                 Molli Barrows

## 2020-02-22 ENCOUNTER — Telehealth: Payer: BC Managed Care – PPO | Admitting: Obstetrics & Gynecology

## 2020-02-27 DIAGNOSIS — M65351 Trigger finger, right little finger: Secondary | ICD-10-CM | POA: Diagnosis not present

## 2020-02-27 DIAGNOSIS — M79644 Pain in right finger(s): Secondary | ICD-10-CM | POA: Diagnosis not present

## 2020-05-13 DIAGNOSIS — H2513 Age-related nuclear cataract, bilateral: Secondary | ICD-10-CM | POA: Diagnosis not present

## 2020-10-28 ENCOUNTER — Other Ambulatory Visit: Payer: Self-pay

## 2020-10-28 ENCOUNTER — Ambulatory Visit: Payer: Medicare Other | Admitting: Dermatology

## 2020-10-28 DIAGNOSIS — Z86018 Personal history of other benign neoplasm: Secondary | ICD-10-CM | POA: Diagnosis not present

## 2020-10-28 DIAGNOSIS — D225 Melanocytic nevi of trunk: Secondary | ICD-10-CM | POA: Diagnosis not present

## 2020-10-28 DIAGNOSIS — D229 Melanocytic nevi, unspecified: Secondary | ICD-10-CM

## 2020-10-28 DIAGNOSIS — L578 Other skin changes due to chronic exposure to nonionizing radiation: Secondary | ICD-10-CM

## 2020-10-28 DIAGNOSIS — L821 Other seborrheic keratosis: Secondary | ICD-10-CM

## 2020-10-28 DIAGNOSIS — Z1283 Encounter for screening for malignant neoplasm of skin: Secondary | ICD-10-CM

## 2020-10-28 DIAGNOSIS — L814 Other melanin hyperpigmentation: Secondary | ICD-10-CM

## 2020-10-28 DIAGNOSIS — D1800 Hemangioma unspecified site: Secondary | ICD-10-CM

## 2020-10-28 NOTE — Progress Notes (Signed)
   Follow-Up Visit   Subjective  Amy Saunders is a 65 y.o. female who presents for the following: tbse (Patient here today for tbse. Patient has history of dysplastic nevi. Patient denies any concerns today. ).  Patient here for full body skin exam and skin cancer screening.   The following portions of the chart were reviewed this encounter and updated as appropriate:       Objective  Well appearing patient in no apparent distress; mood and affect are within normal limits.  A full examination was performed including scalp, head, eyes, ears, nose, lips, neck, chest, axillae, abdomen, back, buttocks, bilateral upper extremities, bilateral lower extremities, hands, feet, fingers, toes, fingernails, and toenails. All findings within normal limits unless otherwise noted below.  Objective  Left Lower Leg - Anterior: 9 x 7 mm speckled light brown macule   Images    Objective  right inframammary, right upper abdomen, right breast: 5 mm brown macule right inframammary   2 mm medium dark brown macule right upper abdomen   3 mm medium dark brown macule right lat breast   Assessment & Plan  Seborrheic keratosis Left Lower Leg - Anterior  Benign-appearing.  Stable. Observation.  Call clinic for new or changing lesions.  Recommend daily use of broad spectrum spf 30+ sunscreen to sun-exposed areas.    Nevus right inframammary, right upper abdomen, right breast  Benign-appearing.  Observation.  Call clinic for new or changing moles.  Recommend daily use of broad spectrum spf 30+ sunscreen to sun-exposed areas.    Lentigines Chest, bilateral legs , arms  - Scattered tan macules - Discussed due to sun exposure - Benign, observe - Call for any changes  Seborrheic Keratoses Chest - Stuck-on, waxy, tan-brown papules and plaques  - Discussed benign etiology and prognosis. - Observe - Call for any changes  Melanocytic Nevi Bilateral legs, face - Tan-brown and/or  pink-flesh-colored symmetric macules and papules - Benign appearing on exam today - Observation - Call clinic for new or changing moles - Recommend daily use of broad spectrum spf 30+ sunscreen to sun-exposed areas.   Hemangiomas Back, chest, arms - Red papules - Discussed benign nature - Observe - Call for any changes  Actinic Damage Chest, back  - Chronic, secondary to cumulative UV/sun exposure - diffuse scaly erythematous macules with underlying dyspigmentation - Recommend daily broad spectrum sunscreen SPF 30+ to sun-exposed areas, reapply every 2 hours as needed.  - Call for new or changing lesions.  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  - Call if any new or changing lesions are noted between office visits    Skin cancer screening performed today.  Return in about 1 year (around 10/28/2021) for tbse.  I, Ruthell Rummage, CMA, am acting as scribe for Brendolyn Patty, MD.  Documentation: I have reviewed the above documentation for accuracy and completeness, and I agree with the above.  Brendolyn Patty MD

## 2020-10-28 NOTE — Patient Instructions (Signed)

## 2020-11-28 ENCOUNTER — Ambulatory Visit: Payer: BC Managed Care – PPO | Admitting: Obstetrics & Gynecology

## 2020-12-11 ENCOUNTER — Ambulatory Visit (INDEPENDENT_AMBULATORY_CARE_PROVIDER_SITE_OTHER): Payer: Medicare Other | Admitting: Obstetrics & Gynecology

## 2020-12-11 ENCOUNTER — Encounter: Payer: Self-pay | Admitting: Obstetrics & Gynecology

## 2020-12-11 ENCOUNTER — Other Ambulatory Visit (HOSPITAL_COMMUNITY)
Admission: RE | Admit: 2020-12-11 | Discharge: 2020-12-11 | Disposition: A | Discharge status: Home / Self Care | Payer: Medicare Other | Source: Ambulatory Visit | Attending: Obstetrics & Gynecology | Admitting: Obstetrics & Gynecology

## 2020-12-11 ENCOUNTER — Other Ambulatory Visit: Payer: Self-pay

## 2020-12-11 VITALS — BP 130/80 | Ht 63.0 in | Wt 127.0 lb

## 2020-12-11 DIAGNOSIS — Z01419 Encounter for gynecological examination (general) (routine) without abnormal findings: Secondary | ICD-10-CM

## 2020-12-11 DIAGNOSIS — N952 Postmenopausal atrophic vaginitis: Secondary | ICD-10-CM

## 2020-12-11 DIAGNOSIS — Z1272 Encounter for screening for malignant neoplasm of vagina: Secondary | ICD-10-CM

## 2020-12-11 DIAGNOSIS — Z1231 Encounter for screening mammogram for malignant neoplasm of breast: Secondary | ICD-10-CM

## 2020-12-11 MED ORDER — REPLENS VA GEL: 1.0000 | VAGINAL | 11 refills | Status: AC

## 2020-12-11 NOTE — Patient Instructions (Signed)
PAP every three years Mammogram every year    Call [phone] to schedule at [facility] Colonoscopy every 5 years Labs yearly (with PCP)  Thank you for choosing [facility]. As part of our ongoing efforts to improve patient experience, we would appreciate your feedback. Please fill out the short survey that you will receive by mail or MyChart. Your opinion is important to [place]! - Dr. Kenton Kingfisher

## 2020-12-11 NOTE — Progress Notes (Signed)
HPI:      [name] is a 65 y.o. [id number] who LMP was in the past, she presents today for her annual examination.  The patient has no complaints today. The patient is sexually active. Replens helps dryness. Herlast pap: approximate date 2019 and was normal and last mammogram: approximate date 2021 and was normal.  The patient does perform self breast exams.  There is no notable family history of breast or ovarian cancer in her family. The patient is not taking hormone replacement therapy. Patient denies post-menopausal vaginal bleeding.   The patient has regular exercise: yes. The patient denies current symptoms of depression.    GYN Hx: Last Colonoscopy:1 year ago. Normal.  Last DEXA: never ago.    PMHx: Past Medical History:  Diagnosis Date  . Diverticulitis   . Dysplastic nevus 02/27/2018   right upper abdomen, severe Atypia, excised   . Dysplastic nevus 03/06/2018   left posterior hip, severe Atypia excised  . Pap smear abnormality of cervix with LGSIL   . Vaginal malignant neoplasm [facility])    Past Surgical History:  Procedure Laterality Date  . ABDOMINAL HYSTERECTOMY    . CHOLECYSTECTOMY OPEN    . COLONOSCOPY    . COLONOSCOPY WITH PROPOFOL N/A 01/04/2020   Procedure: COLONOSCOPY WITH PROPOFOL;  Surgeon: Lin Landsman, MD;  [facility] ENDOSCOPY;  Service: Gastroenterology;  Laterality: N/A;  . DILATION AND CURETTAGE OF UTERUS    . HYSTERECTOMY ABDOMINAL WITH SALPINGECTOMY    . TONSILLECTOMY    . VEIN SURGERY     Family History  Problem Relation Age of Onset  . Heart disease Father   . Lung cancer Father   . Breast cancer Neg Hx    Social History   Tobacco Use  . Smoking status: Never Smoker  . Smokeless tobacco: Never Used  Vaping Use  . Vaping Use: Never used  Substance Use Topics  . Alcohol use: No  . Drug use: No    Current Outpatient Medications:  .  ALPRAZolam (XANAX) 0.25 MG tablet, Take 1 tablet (0.25 mg total) by mouth daily as needed for  anxiety. (Patient not taking: Reported on 12/11/2020), Disp: 20 tablet, Rfl: 0 .  Vaginal Lubricant (REPLENS) GEL, Place 1 Applicatorful vaginally 2 (two) times a week. (Patient not taking: Reported on 12/11/2020), Disp: 35 g, Rfl: 11 Allergies: Patient has no known allergies.  Review of Systems  Constitutional: Negative for chills, fever and malaise/fatigue.  HENT: Negative for congestion, sinus pain and sore throat.   Eyes: Negative for blurred vision and pain.  Respiratory: Negative for cough and wheezing.   Cardiovascular: Negative for chest pain and leg swelling.  Gastrointestinal: Negative for abdominal pain, constipation, diarrhea, heartburn, nausea and vomiting.  Genitourinary: Negative for dysuria, frequency, hematuria and urgency.  Musculoskeletal: Negative for back pain, joint pain, myalgias and neck pain.  Skin: Negative for itching and rash.  Neurological: Negative for dizziness, tremors and weakness.  Endo/Heme/Allergies: Does not bruise/bleed easily.  Psychiatric/Behavioral: Negative for depression. The patient is not nervous/anxious and does not have insomnia.     Objective: BP 130/80   Ht 5\' 3"  (1.6 m)   Wt 127 lb (57.6 kg)   BMI 22.50 kg/m   Filed Weights   12/11/20 1414  Weight: 127 lb (57.6 kg)   Body mass index is 22.5 kg/m. Physical Exam Constitutional:      General: She is not in acute distress.    Appearance: She is well-developed.  Genitourinary:  Vulva, bladder and rectum normal.     No lesions in the vagina.     Genitourinary Comments: Vaginal cuff well healed     No vaginal bleeding.      Right Adnexa: not tender and no mass present.    Left Adnexa: not tender and no mass present.    Cervix is absent.     Uterus is absent.  Breasts:     Right: No mass, skin change or tenderness.     Left: No mass, skin change or tenderness.    HENT:     Head: Normocephalic and atraumatic. No laceration.     Right Ear: Hearing normal.     Left Ear:  Hearing normal.     Mouth/Throat:     Pharynx: Uvula midline.  Eyes:     Pupils: Pupils are equal, round, and reactive to light.  Neck:     Thyroid: No thyromegaly.  Cardiovascular:     Rate and Rhythm: Normal rate and regular rhythm.     Heart sounds: No murmur heard. No friction rub. No gallop.   Pulmonary:     Effort: Pulmonary effort is normal. No respiratory distress.     Breath sounds: Normal breath sounds. No wheezing.  Abdominal:     General: Bowel sounds are normal. There is no distension.     Palpations: Abdomen is soft.     Tenderness: There is no abdominal tenderness. There is no rebound.  Musculoskeletal:        General: Normal range of motion.     Cervical back: Normal range of motion and neck supple.  Neurological:     Mental Status: She is alert and oriented to person, place, and time.     Cranial Nerves: No cranial nerve deficit.  Skin:    General: Skin is warm and dry.  Psychiatric:        Judgment: Judgment normal.  Vitals reviewed.     Assessment: Annual Exam 1. Women's annual routine gynecological examination   2. Encounter for screening mammogram for malignant neoplasm of breast   3. Screening for vaginal cancer     Plan:            1.  Cervical Screening-  Pap smear done today  2. Breast screening- Exam annually and mammogram scheduled  3. Colonoscopy every 10 years, Hemoccult testing after age 18  4. Labs Declines  5. Counseling for hormonal therapy: none              6. FRAX - FRAX score for assessing the 10 year probability for fracture calculated and discussed today.  Based on age and score today, DEXA is not currently scheduled.   7. Replens for vag dryness, continue   F/U  Return in about 1 year (around 12/11/2021) for Annual.  Barnett Applebaum, MD, [facility], [facility] 12/11/2020  3:13 PM

## 2020-12-15 LAB — CYTOLOGY - PAP: Diagnosis: NEGATIVE

## 2021-01-22 ENCOUNTER — Ambulatory Visit
Admission: RE | Admit: 2021-01-22 | Discharge: 2021-01-22 | Disposition: A | Discharge status: Home / Self Care | Payer: Medicare Other | Source: Ambulatory Visit | Attending: Obstetrics & Gynecology | Admitting: Obstetrics & Gynecology

## 2021-01-22 ENCOUNTER — Other Ambulatory Visit: Payer: Self-pay

## 2021-01-22 DIAGNOSIS — Z1231 Encounter for screening mammogram for malignant neoplasm of breast: Secondary | ICD-10-CM | POA: Insufficient documentation

## 2021-11-03 ENCOUNTER — Other Ambulatory Visit: Payer: Self-pay

## 2021-11-03 ENCOUNTER — Ambulatory Visit: Payer: Medicare Other | Admitting: Dermatology

## 2021-11-03 DIAGNOSIS — L814 Other melanin hyperpigmentation: Secondary | ICD-10-CM

## 2021-11-03 DIAGNOSIS — Z1283 Encounter for screening for malignant neoplasm of skin: Secondary | ICD-10-CM

## 2021-11-03 DIAGNOSIS — L821 Other seborrheic keratosis: Secondary | ICD-10-CM | POA: Diagnosis not present

## 2021-11-03 DIAGNOSIS — D1800 Hemangioma unspecified site: Secondary | ICD-10-CM

## 2021-11-03 DIAGNOSIS — I8393 Asymptomatic varicose veins of bilateral lower extremities: Secondary | ICD-10-CM

## 2021-11-03 DIAGNOSIS — D225 Melanocytic nevi of trunk: Secondary | ICD-10-CM | POA: Diagnosis not present

## 2021-11-03 DIAGNOSIS — Z86018 Personal history of other benign neoplasm: Secondary | ICD-10-CM

## 2021-11-03 DIAGNOSIS — D229 Melanocytic nevi, unspecified: Secondary | ICD-10-CM

## 2021-11-03 DIAGNOSIS — L578 Other skin changes due to chronic exposure to nonionizing radiation: Secondary | ICD-10-CM

## 2021-11-03 NOTE — Progress Notes (Signed)
Follow-Up Visit   Subjective  Amy Saunders is a 66 y.o. female who presents for the following: Follow-up (Patient here today for 1 year tbse. She reports a small spot behind left ear. ). Not bothersome.  The patient presents for Total-Body Skin Exam (TBSE) for skin cancer screening and mole check.  The patient has spots, moles and lesions to be evaluated, some may be new or changing and the patient has concerns that these could be cancer.   The following portions of the chart were reviewed this encounter and updated as appropriate:      Review of Systems: No other skin or systemic complaints except as noted in HPI or Assessment and Plan.   Objective  Well appearing patient in no apparent distress; mood and affect are within normal limits.  A full examination was performed including scalp, head, eyes, ears, nose, lips, neck, chest, axillae, abdomen, back, buttocks, bilateral upper extremities, bilateral lower extremities, hands, feet, fingers, toes, fingernails, and toenails. All findings within normal limits unless otherwise noted below.  right inframammary 5 mm brown macule   right upper abdomen 2 mm medium dark brown macule   right lateral breast 3 mm medium dark brown macule   Left Breast 8 x 4 mm pink brown papule with 2 mm darker center   left scapula 2.5 mm medium dark brown macule   Left Lower Leg - Anterior 9 x 7 mm speckled light brown macule, no change when compared to prior photo.   Assessment & Plan  Nevus (5) Left Breast; right lateral breast; right upper abdomen; right inframammary; left scapula  Benign-appearing.  Observation. Stable   Call clinic for new or changing lesions.  Recommend daily use of broad spectrum spf 30+ sunscreen to sun-exposed areas.    Seborrheic keratosis Left Lower Leg - Anterior  Benign-appearing.  Stable. Observation.  Call clinic for new or changing lesions.  Recommend daily use of broad spectrum spf 30+ sunscreen to  sun-exposed areas.    Lentigines - Scattered tan macules - Due to sun exposure - Benign-appearing, observe - Recommend daily broad spectrum sunscreen SPF 30+ to sun-exposed areas, reapply every 2 hours as needed. - Call for any changes  Varicose Veins/Spider Veins - Dilated blue, purple or red veins at the lower extremities - Reassured - Smaller vessels can be treated by sclerotherapy (a procedure to inject a medicine into the veins to make them disappear) if desired, but the treatment is not covered by insurance. Larger vessels may be covered if symptomatic and we would refer to vascular surgeon if treatment desired.  Seborrheic Keratoses - Stuck-on, waxy, tan-brown papules and/or plaques left postauricular hairline - Benign-appearing - Discussed benign etiology and prognosis. - Observe - Call for any changes  Melanocytic Nevi - Tan-brown and/or pink-flesh-colored symmetric macules and papules - Benign appearing on exam today - Observation - Call clinic for new or changing moles - Recommend daily use of broad spectrum spf 30+ sunscreen to sun-exposed areas.   Hemangiomas - Red papules - Discussed benign nature - Observe - Call for any changes  Actinic Damage - Chronic condition, secondary to cumulative UV/sun exposure - diffuse scaly erythematous macules with underlying dyspigmentation - Recommend daily broad spectrum sunscreen SPF 30+ to sun-exposed areas, reapply every 2 hours as needed.  - Staying in the shade or wearing long sleeves, sun glasses (UVA+UVB protection) and wide brim hats (4-inch brim around the entire circumference of the hat) are also recommended for sun protection.  - Call  for new or changing lesions.  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  - Call if any new or changing lesions are noted between office visits  Skin cancer screening performed today. Return for 1 year tbse . I, Ruthell Rummage, CMA, am acting as scribe for Brendolyn Patty, MD.  Documentation: I have reviewed the above documentation for accuracy and completeness, and I agree with the above.  Brendolyn Patty MD

## 2021-11-03 NOTE — Patient Instructions (Addendum)
Seborrheic Keratosis  What causes seborrheic keratoses? Seborrheic keratoses are harmless, common skin growths that first appear during adult life.  As time goes by, more growths appear.  Some people may develop a large number of them.  Seborrheic keratoses appear on both covered and uncovered body parts.  They are not caused by sunlight.  The tendency to develop seborrheic keratoses can be inherited.  They vary in color from skin-colored to gray, brown, or even black.  They can be either smooth or have a rough, warty surface.   Seborrheic keratoses are superficial and look as if they were stuck on the skin.  Under the microscope this type of keratosis looks like layers upon layers of skin.  That is why at times the top layer may seem to fall off, but the rest of the growth remains and re-grows.    Treatment Seborrheic keratoses do not need to be treated, but can easily be removed in the office.  Seborrheic keratoses often cause symptoms when they rub on clothing or jewelry.  Lesions can be in the way of shaving.  If they become inflamed, they can cause itching, soreness, or burning.  Removal of a seborrheic keratosis can be accomplished by freezing, burning, or surgery. If any spot bleeds, scabs, or grows rapidly, please return to have it checked, as these can be an indication of a skin cancer.   Melanoma ABCDEs  Melanoma is the most dangerous type of skin cancer, and is the leading cause of death from skin disease.  You are more likely to develop melanoma if you: Have light-colored skin, light-colored eyes, or red or blond hair Spend a lot of time in the sun Tan regularly, either outdoors or in a tanning bed Have had blistering sunburns, especially during childhood Have a close family member who has had a melanoma Have atypical moles or large birthmarks  Early detection of melanoma is key since treatment is typically straightforward and cure rates are extremely high if we catch it early.    The first sign of melanoma is often a change in a mole or a new dark spot.  The ABCDE system is a way of remembering the signs of melanoma.  A for asymmetry:  The two halves do not match. B for border:  The edges of the growth are irregular. C for color:  A mixture of colors are present instead of an even brown color. D for diameter:  Melanomas are usually (but not always) greater than 50mm - the size of a pencil eraser. E for evolution:  The spot keeps changing in size, shape, and color.  Please check your skin once per month between visits. You can use a small mirror in front and a large mirror behind you to keep an eye on the back side or your body.   If you see any new or changing lesions before your next follow-up, please call to schedule a visit.  Please continue daily skin protection including broad spectrum sunscreen SPF 30+ to sun-exposed areas, reapplying every 2 hours as needed when you're outdoors.   Staying in the shade or wearing long sleeves, sun glasses (UVA+UVB protection) and wide brim hats (4-inch brim around the entire circumference of the hat) are also recommended for sun protection.    If You Need Anything After Your Visit  If you have any questions or concerns for your doctor, please call our main line at [phone] and press option 4 to reach your doctor's medical assistant. If no  one answers, please leave a voicemail as directed and we will return your call as soon as possible. Messages left after 4 pm will be answered the following business day.   You may also send [place] a message via [place]. We typically respond to MyChart messages within 1-2 business days.  For prescription refills, please ask your pharmacy to contact our office. Our fax number is [phone].  If you have an urgent issue when the clinic is closed that cannot wait until the next business day, you can page your doctor at the number below.    Please note that while we do our best to be  available for urgent issues outside of office hours, we are not available 24/7.   If you have an urgent issue and are unable to reach [place], you may choose to seek medical care at your doctor's office, retail clinic, urgent care center, or emergency room.  If you have a medical emergency, please immediately call 911 or go to the emergency department.  Pager Numbers  - Dr. Nehemiah Massed: [phone]  - Dr. Laurence Ferrari: [phone]  - Dr. Nicole Kindred: [phone]  In the event of inclement weather, please call our main line at [phone] for an update on the status of any delays or closures.  Dermatology Medication Tips: Please keep the boxes that topical medications come in in order to help keep track of the instructions about where and how to use these. Pharmacies typically print the medication instructions only on the boxes and not directly on the medication tubes.   If your medication is too expensive, please contact our office at [phone] option 4 or send [place] a message through [place].   We are unable to tell what your co-pay for medications will be in advance as this is different depending on your insurance coverage. However, we may be able to find a substitute medication at lower cost or fill out paperwork to get insurance to cover a needed medication.   If a prior authorization is required to get your medication covered by your insurance company, please allow [place] 1-2 business days to complete this process.  Drug prices often vary depending on where the prescription is filled and some pharmacies may offer cheaper prices.  The website www.goodrx.com contains coupons for medications through different pharmacies. The prices here do not account for what the cost may be with help from insurance (it may be cheaper with your insurance), but the website can give you the price if you did not use any insurance.  - You can print the associated coupon and take it with your prescription to the pharmacy.  -  You may also stop by our office during regular business hours and pick up a GoodRx coupon card.  - If you need your prescription sent electronically to a different pharmacy, notify our office through [facility] or by phone at [phone] option 4.     Si Usted Necesita Algo Despus de Su Visita  Tambin puede enviarnos un mensaje a travs de Pharmacist, community. Por lo general respondemos a los mensajes de MyChart en el transcurso de 1 a 2 das hbiles.  Para renovar recetas, por favor pida a su farmacia que se ponga en contacto con nuestra oficina. Harland Dingwall de fax es [place] [phone].  Si tiene un asunto urgente cuando la clnica est cerrada y que no puede esperar hasta el siguiente da hbil, puede llamar/localizar a su doctor(a) al nmero que aparece a continuacin.   Por favor, tenga en cuenta  que aunque hacemos todo lo posible para estar disponibles para asuntos urgentes fuera del horario de [place], no estamos disponibles las 24 horas del da, los 7 das de la [place].   Si tiene un problema urgente y no puede comunicarse con nosotros, puede optar por buscar atencin mdica  en el consultorio de su doctor(a), en una clnica privada, en un centro de atencin urgente o en una sala de emergencias.  Si tiene Engineering geologist, por favor llame inmediatamente al 911 o vaya a la sala de emergencias.  Nmeros de bper  - Dr. Nehemiah Massed: [phone]  - Dra. Moye: [phone]  - Dra. Nicole Kindred: [phone]  En caso de inclemencias del [place], por favor llame a Johnsie Kindred principal al [phone] para una actualizacin sobre el [place] de cualquier retraso o cierre.  Consejos para la medicacin en dermatologa: Por favor, guarde las cajas en las que vienen los medicamentos de uso tpico para ayudarle a seguir las instrucciones sobre dnde y cmo usarlos. Las farmacias generalmente imprimen las instrucciones del medicamento slo en las cajas y no directamente en los tubos del  [place].   Si su medicamento es muy caro, por favor, pngase en contacto con Zigmund Daniel llamando al [phone] y presione la opcin 4 o envenos un mensaje a travs de Pharmacist, community.   No podemos decirle cul ser su copago por los medicamentos por adelantado ya que esto es diferente dependiendo de la cobertura de su seguro. Sin embargo, es posible que podamos encontrar un medicamento sustituto a Electrical engineer un formulario para que el seguro cubra el medicamento que se considera necesario.   Si se requiere una autorizacin previa para que su compaa de seguros [place] su medicamento, por favor permtanos de 1 a 2 das hbiles para completar este proceso.  Los precios de los medicamentos varan con frecuencia dependiendo del Environmental consultant de dnde se surte la receta y alguna farmacias pueden ofrecer precios ms baratos.  El sitio web www.goodrx.com tiene cupones para medicamentos de Airline pilot. Los precios aqu no tienen en cuenta lo que podra costar con la ayuda del seguro (puede ser ms barato con su seguro), pero el sitio web puede darle el precio si no utiliz Research scientist (physical sciences).  - Puede imprimir el cupn correspondiente y llevarlo con su receta a la farmacia.  - Tambin puede pasar por nuestra oficina durante el horario de atencin regular y Charity fundraiser una tarjeta de cupones de GoodRx.  - Si necesita que su receta se enve electrnicamente a una farmacia diferente, informe a nuestra oficina a travs de MyChart de [place] o por telfono llamando al [phone] y presione la opcin 4.

## 2022-01-15 ENCOUNTER — Ambulatory Visit: Payer: Medicare Other | Admitting: Advanced Practice Midwife

## 2022-02-25 ENCOUNTER — Ambulatory Visit: Payer: Medicare Other | Admitting: Licensed Practical Nurse

## 2022-03-08 ENCOUNTER — Encounter: Payer: Self-pay | Admitting: Obstetrics

## 2022-03-08 ENCOUNTER — Ambulatory Visit (INDEPENDENT_AMBULATORY_CARE_PROVIDER_SITE_OTHER): Payer: Medicare Other | Admitting: Obstetrics

## 2022-03-08 VITALS — BP 122/76 | Ht 63.0 in | Wt 126.4 lb

## 2022-03-08 DIAGNOSIS — N952 Postmenopausal atrophic vaginitis: Secondary | ICD-10-CM

## 2022-03-08 DIAGNOSIS — Z13820 Encounter for screening for osteoporosis: Secondary | ICD-10-CM

## 2022-03-08 DIAGNOSIS — Z01411 Encounter for gynecological examination (general) (routine) with abnormal findings: Secondary | ICD-10-CM

## 2022-03-08 DIAGNOSIS — Z1231 Encounter for screening mammogram for malignant neoplasm of breast: Secondary | ICD-10-CM

## 2022-03-10 ENCOUNTER — Other Ambulatory Visit: Payer: Medicare Other

## 2022-03-11 LAB — COMPREHENSIVE METABOLIC PANEL
ALT: 13 IU/L (ref 0–32)
AST: 17 IU/L (ref 0–40)
Albumin/Globulin Ratio: 1.7 (ref 1.2–2.2)
Albumin: 4.3 g/dL (ref 3.8–4.8)
Alkaline Phosphatase: 96 IU/L (ref 44–121)
BUN/Creatinine Ratio: 27 (ref 12–28)
BUN: 23 mg/dL (ref 8–27)
Bilirubin Total: 0.5 mg/dL (ref 0.0–1.2)
CO2: 23 mmol/L (ref 20–29)
Calcium: 9.5 mg/dL (ref 8.7–10.3)
Chloride: 104 mmol/L (ref 96–106)
Creatinine, Ser: 0.84 mg/dL (ref 0.57–1.00)
Globulin, Total: 2.5 g/dL (ref 1.5–4.5)
Glucose: 96 mg/dL (ref 70–99)
Potassium: 4.5 mmol/L (ref 3.5–5.2)
Sodium: 141 mmol/L (ref 134–144)
Total Protein: 6.8 g/dL (ref 6.0–8.5)
eGFR: 77 mL/min/{1.73_m2} (ref >59)

## 2022-03-11 LAB — CBC WITH DIFFERENTIAL/PLATELET
Basophils Absolute: 0 10*3/uL (ref 0.0–0.2)
Basos: 1 %
EOS (ABSOLUTE): 0.3 10*3/uL (ref 0.0–0.4)
Eos: 6 %
Hematocrit: 42.7 % (ref 34.0–46.6)
Hemoglobin: 14.6 g/dL (ref 11.1–15.9)
Immature Grans (Abs): 0 10*3/uL (ref 0.0–0.1)
Immature Granulocytes: 0 %
Lymphocytes Absolute: 1.7 10*3/uL (ref 0.7–3.1)
Lymphs: 31 %
MCH: 29.1 pg (ref 26.6–33.0)
MCHC: 34.2 g/dL (ref 31.5–35.7)
MCV: 85 fL (ref 79–97)
Monocytes Absolute: 0.4 10*3/uL (ref 0.1–0.9)
Monocytes: 7 %
Neutrophils Absolute: 3.1 10*3/uL (ref 1.4–7.0)
Neutrophils: 55 %
Platelets: 218 10*3/uL (ref 150–450)
RBC: 5.02 x10E6/uL (ref 3.77–5.28)
RDW: 12.8 % (ref 11.7–15.4)
WBC: 5.5 10*3/uL (ref 3.4–10.8)

## 2022-03-11 LAB — LIPID PANEL
Chol/HDL Ratio: 2.7 ratio (ref 0.0–4.4)
Cholesterol, Total: 193 mg/dL (ref 100–199)
HDL: 71 mg/dL (ref >39)
LDL Chol Calc (NIH): 107 mg/dL — ABNORMAL HIGH (ref 0–99)
Triglycerides: 81 mg/dL (ref 0–149)
VLDL Cholesterol Cal: 15 mg/dL (ref 5–40)

## 2022-03-11 LAB — TSH RFX ON ABNORMAL TO FREE T4: TSH: 1.83 u[IU]/mL (ref 0.450–4.500)

## 2022-03-11 LAB — VITAMIN D 25 HYDROXY (VIT D DEFICIENCY, FRACTURES): Vit D, 25-Hydroxy: 33.4 ng/mL (ref 30.0–100.0)

## 2022-03-11 LAB — HEMOGLOBIN A1C
Est. average glucose Bld gHb Est-mCnc: 117 mg/dL
Hgb A1c MFr Bld: 5.7 % — ABNORMAL HIGH (ref 4.8–5.6)

## 2022-05-12 ENCOUNTER — Other Ambulatory Visit: Payer: Medicare Other

## 2022-05-13 ENCOUNTER — Ambulatory Visit
Admission: RE | Admit: 2022-05-13 | Discharge: 2022-05-13 | Disposition: A | Discharge status: Home / Self Care | Payer: Medicare Other | Source: Ambulatory Visit | Attending: Obstetrics | Admitting: Obstetrics

## 2022-05-13 DIAGNOSIS — M810 Age-related osteoporosis without current pathological fracture: Secondary | ICD-10-CM | POA: Diagnosis not present

## 2022-05-13 DIAGNOSIS — Z1231 Encounter for screening mammogram for malignant neoplasm of breast: Secondary | ICD-10-CM | POA: Insufficient documentation

## 2022-05-13 DIAGNOSIS — Z13820 Encounter for screening for osteoporosis: Secondary | ICD-10-CM

## 2022-05-13 DIAGNOSIS — Z780 Asymptomatic menopausal state: Secondary | ICD-10-CM | POA: Diagnosis not present

## 2022-07-25 IMAGING — MG MM DIGITAL SCREENING BILAT W/ TOMO AND CAD
8 series · 8 of 24 positions shown · non-contrast
Comparison: Previous exam(s).

CLINICAL DATA: Screening.

EXAM:
DIGITAL SCREENING BILATERAL MAMMOGRAM WITH TOMOSYNTHESIS AND CAD
TECHNIQUE: Bilateral screening digital craniocaudal and mediolateral oblique
mammograms were obtained. Bilateral screening digital breast
tomosynthesis was performed. The images were evaluated with
computer-aided detection.

[L CC synth-2D]
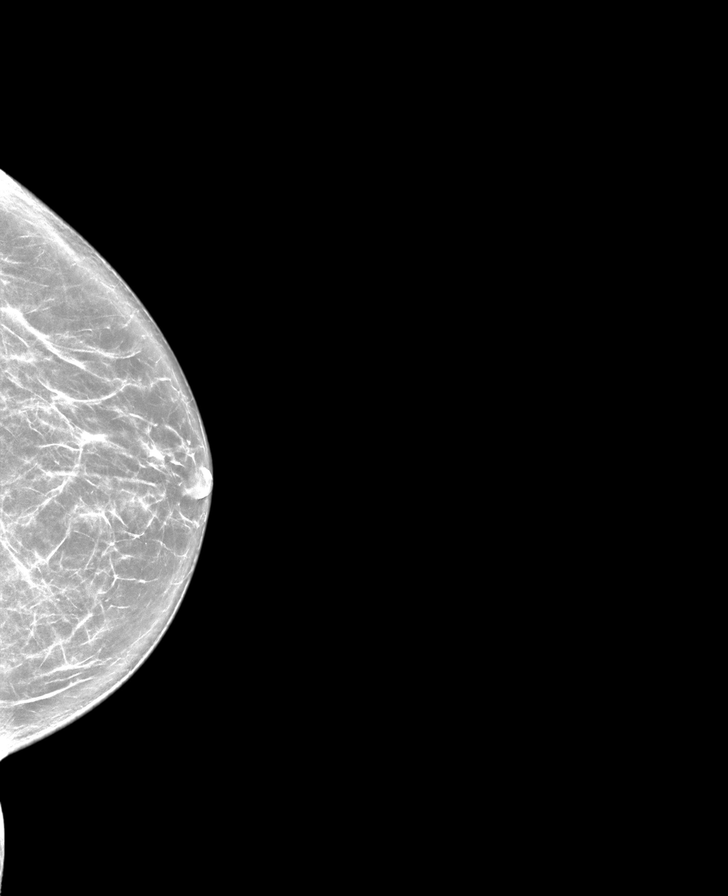

[R CC synth-2D]
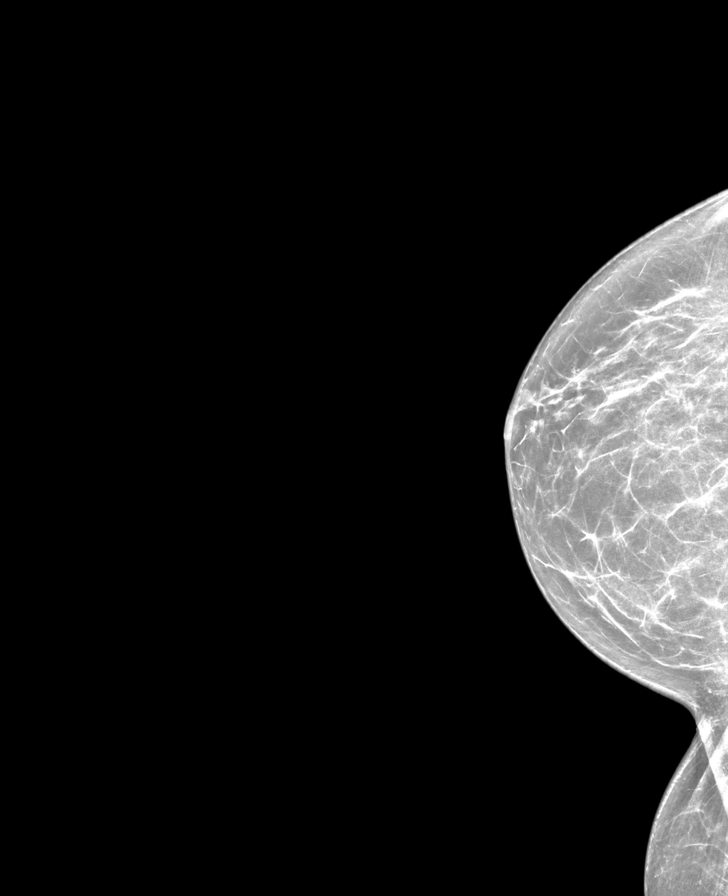

[R MLO synth-2D]
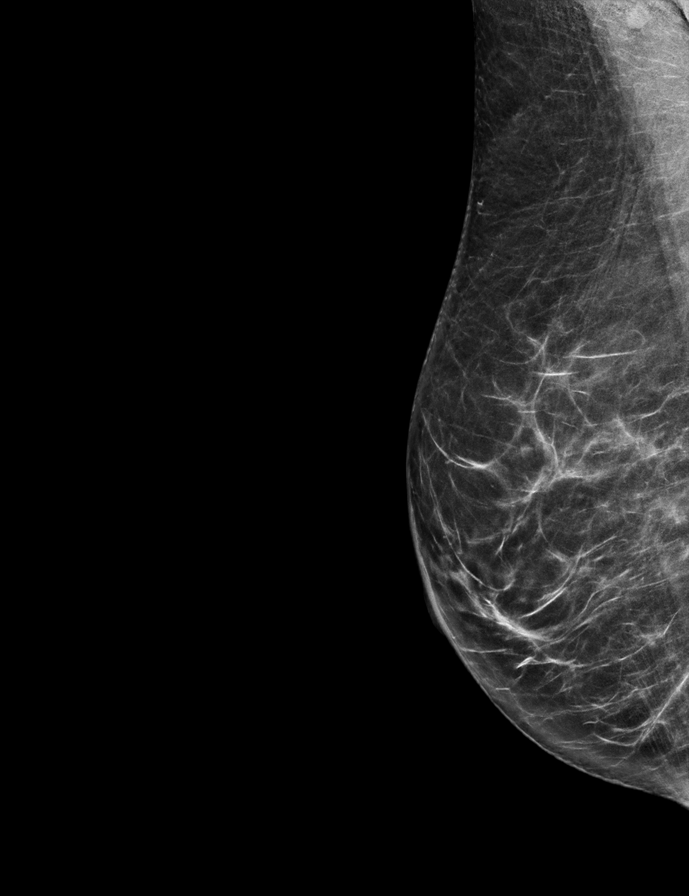

[L MLO synth-2D]
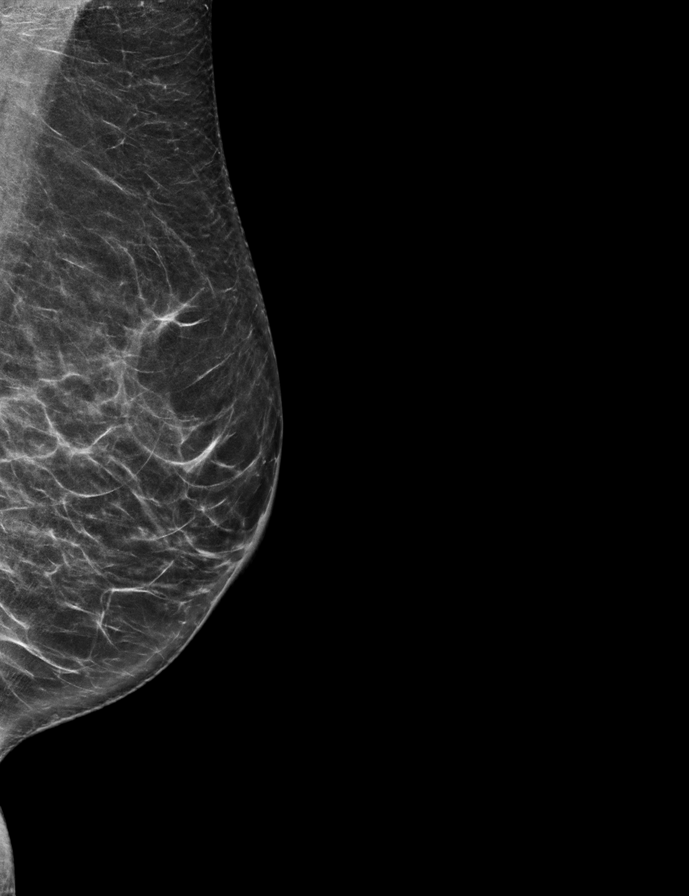

[L CC tomo · tomo slice 35/70.0]
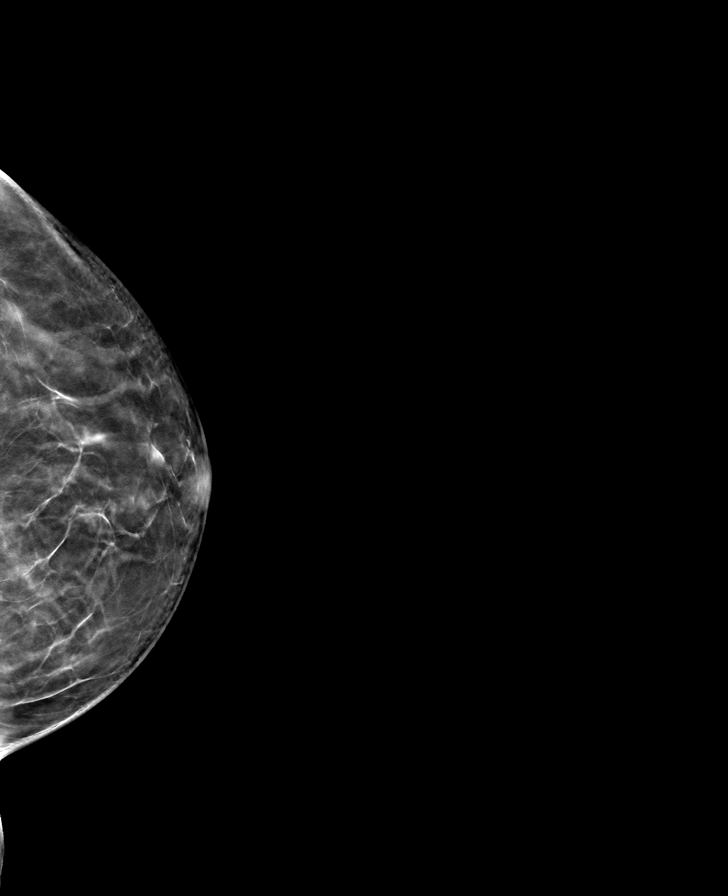

[R MLO tomo · tomo slice 31/62.0]
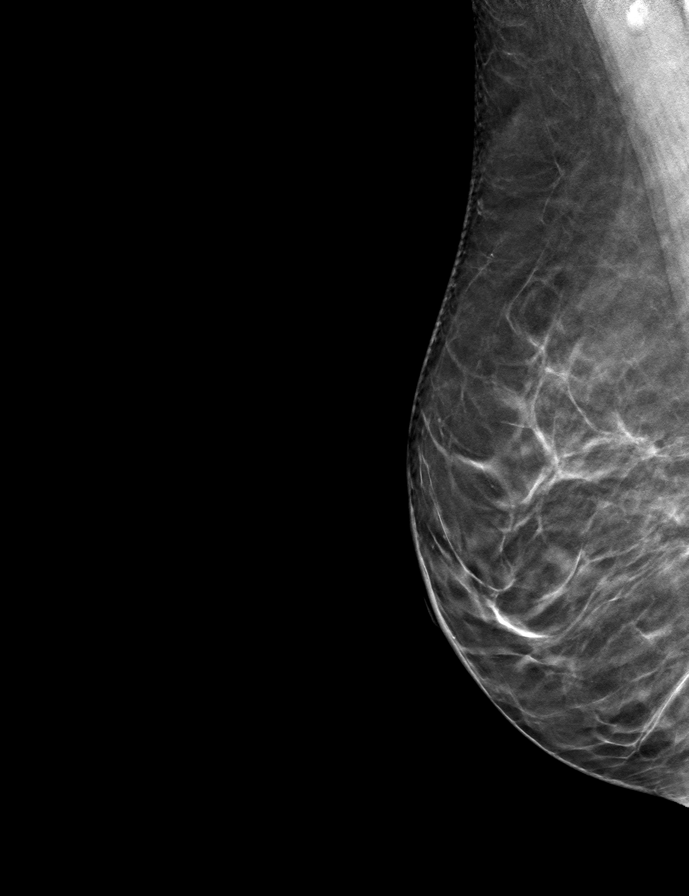

[R CC tomo · tomo slice 31/62.0]
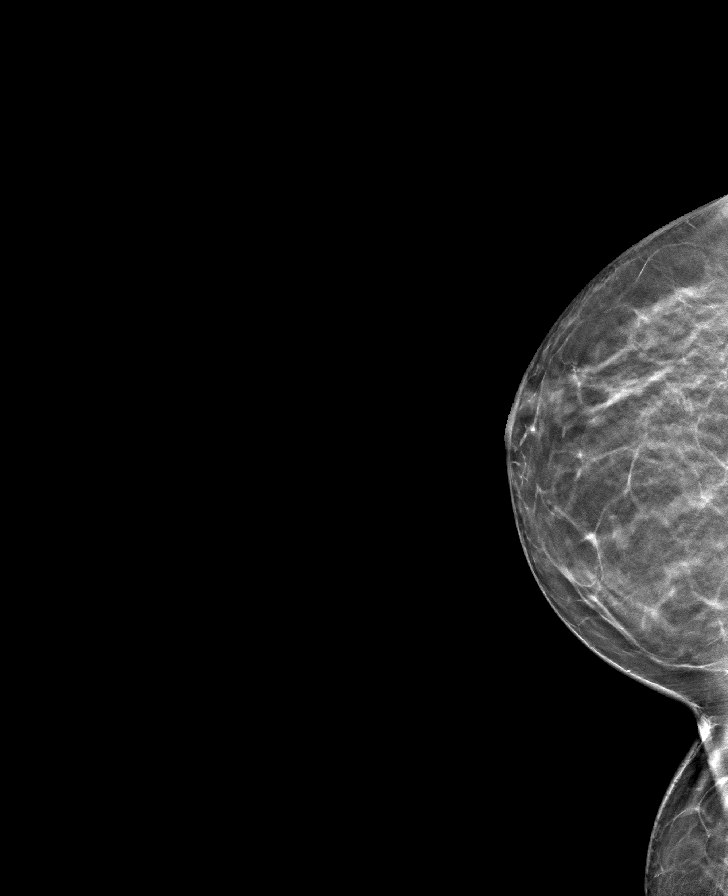

[L MLO tomo · tomo slice 33/65.0]
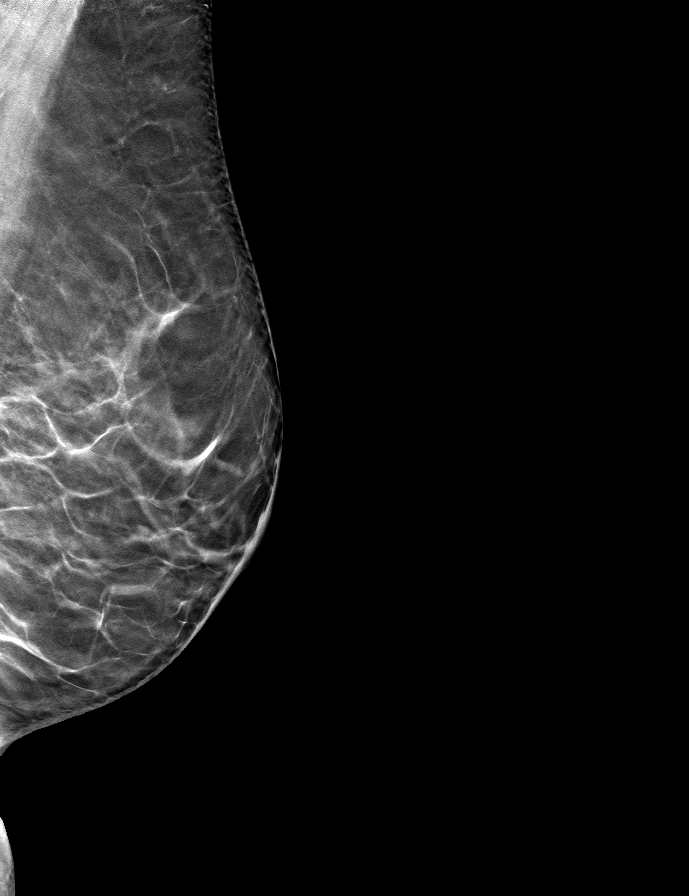

[8 of 24 positions shown; findings below may reference images not displayed]

ACR Breast Density Category b: There are scattered areas of
fibroglandular density.
FINDINGS: There are no findings suspicious for malignancy. The images were
evaluated with computer-aided detection.
IMPRESSION: No mammographic evidence of malignancy. A result letter of this
screening mammogram will be mailed directly to the patient.

RECOMMENDATION:
Screening mammogram in one year. (Code:[id number])

BI-RADS CATEGORY  1: Negative.

## 2022-11-02 ENCOUNTER — Other Ambulatory Visit: Payer: Self-pay | Admitting: Nurse Practitioner

## 2022-11-02 DIAGNOSIS — Z1231 Encounter for screening mammogram for malignant neoplasm of breast: Secondary | ICD-10-CM

## 2022-11-08 ENCOUNTER — Ambulatory Visit (INDEPENDENT_AMBULATORY_CARE_PROVIDER_SITE_OTHER): Payer: Medicare HMO | Admitting: Dermatology

## 2022-11-08 VITALS — BP 132/77 | HR 61

## 2022-11-08 DIAGNOSIS — L821 Other seborrheic keratosis: Secondary | ICD-10-CM

## 2022-11-08 DIAGNOSIS — L820 Inflamed seborrheic keratosis: Secondary | ICD-10-CM | POA: Diagnosis not present

## 2022-11-08 DIAGNOSIS — Z86018 Personal history of other benign neoplasm: Secondary | ICD-10-CM

## 2022-11-08 DIAGNOSIS — D225 Melanocytic nevi of trunk: Secondary | ICD-10-CM

## 2022-11-08 DIAGNOSIS — Z1283 Encounter for screening for malignant neoplasm of skin: Secondary | ICD-10-CM

## 2022-11-08 DIAGNOSIS — L578 Other skin changes due to chronic exposure to nonionizing radiation: Secondary | ICD-10-CM

## 2022-11-08 DIAGNOSIS — D224 Melanocytic nevi of scalp and neck: Secondary | ICD-10-CM

## 2022-11-08 DIAGNOSIS — L814 Other melanin hyperpigmentation: Secondary | ICD-10-CM | POA: Diagnosis not present

## 2022-11-08 DIAGNOSIS — D2239 Melanocytic nevi of other parts of face: Secondary | ICD-10-CM

## 2022-11-08 DIAGNOSIS — D2272 Melanocytic nevi of left lower limb, including hip: Secondary | ICD-10-CM | POA: Diagnosis not present

## 2022-11-08 DIAGNOSIS — D229 Melanocytic nevi, unspecified: Secondary | ICD-10-CM

## 2022-11-08 NOTE — Patient Instructions (Addendum)
Cryotherapy Aftercare  Wash gently with soap and water everyday.   Apply Vaseline and Band-Aid daily until healed.   Melanoma ABCDEs  Melanoma is the most dangerous type of skin cancer, and is the leading cause of death from skin disease.  You are more likely to develop melanoma if you: Have light-colored skin, light-colored eyes, or red or blond hair Spend a lot of time in the sun Tan regularly, either outdoors or in a tanning bed Have had blistering sunburns, especially during childhood Have a close family member who has had a melanoma Have atypical moles or large birthmarks  Early detection of melanoma is key since treatment is typically straightforward and cure rates are extremely high if we catch it early.   The first sign of melanoma is often a change in a mole or a new dark spot.  The ABCDE system is a way of remembering the signs of melanoma.  A for asymmetry:  The two halves do not match. B for border:  The edges of the growth are irregular. C for color:  A mixture of colors are present instead of an even brown color. D for diameter:  Melanomas are usually (but not always) greater than 6mm - the size of a pencil eraser. E for evolution:  The spot keeps changing in size, shape, and color.  Please check your skin once per month between visits. You can use a small mirror in front and a large mirror behind you to keep an eye on the back side or your body.   If you see any new or changing lesions before your next follow-up, please call to schedule a visit.  Please continue daily skin protection including broad spectrum sunscreen SPF 30+ to sun-exposed areas, reapplying every 2 hours as needed when you're outdoors.   Staying in the shade or wearing long sleeves, sun glasses (UVA+UVB protection) and wide brim hats (4-inch brim around the entire circumference of the hat) are also recommended for sun protection.      Due to recent changes in healthcare laws, you may see results of  your pathology and/or laboratory studies on MyChart before the doctors have had a chance to review them. We understand that in some cases there may be results that are confusing or concerning to you. Please understand that not all results are received at the same time and often the doctors may need to interpret multiple results in order to provide you with the best plan of care or course of treatment. Therefore, we ask that you please give us 2 business days to thoroughly review all your results before contacting the office for clarification. Should we see a critical lab result, you will be contacted sooner.   If You Need Anything After Your Visit  If you have any questions or concerns for your doctor, please call our main line at [phone] and press option 4 to reach your doctor's medical assistant. If no one answers, please leave a voicemail as directed and we will return your call as soon as possible. Messages left after 4 pm will be answered the following business day.   You may also send us a message via MyChart. We typically respond to MyChart messages within 1-2 business days.  For prescription refills, please ask your pharmacy to contact our office. Our fax number is [phone].  If you have an urgent issue when the clinic is closed that cannot wait until the next business day, you can page your doctor at the number   below.    Please note that while we do our best to be available for urgent issues outside of office hours, we are not available 24/7.   If you have an urgent issue and are unable to reach us, you may choose to seek medical care at your doctor's office, retail clinic, urgent care center, or emergency room.  If you have a medical emergency, please immediately call 911 or go to the emergency department.  Pager Numbers  - Dr. Kowalski: [phone]  - Dr. Moye: [phone]  - Dr. Stewart: [phone]  In the event of inclement weather, please call our main line at  [phone] for an update on the status of any delays or closures.  Dermatology Medication Tips: Please keep the boxes that topical medications come in in order to help keep track of the instructions about where and how to use these. Pharmacies typically print the medication instructions only on the boxes and not directly on the medication tubes.   If your medication is too expensive, please contact our office at [phone] option 4 or send us a message through MyChart.   We are unable to tell what your co-pay for medications will be in advance as this is different depending on your insurance coverage. However, we may be able to find a substitute medication at lower cost or fill out paperwork to get insurance to cover a needed medication.   If a prior authorization is required to get your medication covered by your insurance company, please allow us 1-2 business days to complete this process.  Drug prices often vary depending on where the prescription is filled and some pharmacies may offer cheaper prices.  The website www.goodrx.com contains coupons for medications through different pharmacies. The prices here do not account for what the cost may be with help from insurance (it may be cheaper with your insurance), but the website can give you the price if you did not use any insurance.  - You can print the associated coupon and take it with your prescription to the pharmacy.  - You may also stop by our office during regular business hours and pick up a GoodRx coupon card.  - If you need your prescription sent electronically to a different pharmacy, notify our office through  MyChart or by phone at [phone] option 4.     Si Usted Necesita Algo Despus de Su Visita  Tambin puede enviarnos un mensaje a travs de MyChart. Por lo general respondemos a los mensajes de MyChart en el transcurso de 1 a 2 das hbiles.  Para renovar recetas, por favor pida a su farmacia que se  ponga en contacto con nuestra oficina. Nuestro nmero de fax es el [phone].  Si tiene un asunto urgente cuando la clnica est cerrada y que no puede esperar hasta el siguiente da hbil, puede llamar/localizar a su doctor(a) al nmero que aparece a continuacin.   Por favor, tenga en cuenta que aunque hacemos todo lo posible para estar disponibles para asuntos urgentes fuera del horario de oficina, no estamos disponibles las 24 horas del da, los 7 das de la semana.   Si tiene un problema urgente y no puede comunicarse con nosotros, puede optar por buscar atencin mdica  en el consultorio de su doctor(a), en una clnica privada, en un centro de atencin urgente o en una sala de emergencias.  Si tiene una emergencia mdica, por favor llame inmediatamente al 911 o vaya a la sala de emergencias.  Nmeros de bper  -   Dr. Kowalski: [phone]  - Dra. Moye: [phone]  - Dra. Stewart: [phone]  En caso de inclemencias del tiempo, por favor llame a nuestra lnea principal al [phone] para una actualizacin sobre el estado de cualquier retraso o cierre.  Consejos para la medicacin en dermatologa: Por favor, guarde las cajas en las que vienen los medicamentos de uso tpico para ayudarle a seguir las instrucciones sobre dnde y cmo usarlos. Las farmacias generalmente imprimen las instrucciones del medicamento slo en las cajas y no directamente en los tubos del medicamento.   Si su medicamento es muy caro, por favor, pngase en contacto con nuestra oficina llamando al [phone] y presione la opcin 4 o envenos un mensaje a travs de MyChart.   No podemos decirle cul ser su copago por los medicamentos por adelantado ya que esto es diferente dependiendo de la cobertura de su seguro. Sin embargo, es posible que podamos encontrar un medicamento sustituto a menor costo o llenar un formulario para que el seguro cubra el medicamento que se considera necesario.   Si se requiere  una autorizacin previa para que su compaa de seguros cubra su medicamento, por favor permtanos de 1 a 2 das hbiles para completar este proceso.  Los precios de los medicamentos varan con frecuencia dependiendo del lugar de dnde se surte la receta y alguna farmacias pueden ofrecer precios ms baratos.  El sitio web www.goodrx.com tiene cupones para medicamentos de diferentes farmacias. Los precios aqu no tienen en cuenta lo que podra costar con la ayuda del seguro (puede ser ms barato con su seguro), pero el sitio web puede darle el precio si no utiliz ningn seguro.  - Puede imprimir el cupn correspondiente y llevarlo con su receta a la farmacia.  - Tambin puede pasar por nuestra oficina durante el horario de atencin regular y recoger una tarjeta de cupones de GoodRx.  - Si necesita que su receta se enve electrnicamente a una farmacia diferente, informe a nuestra oficina a travs de MyChart de [place] o por telfono llamando al [phone] y presione la opcin 4.  

## 2022-11-08 NOTE — Progress Notes (Signed)
Follow-Up Visit   Subjective  Jeffie Clara is a 67 y.o. female who presents for the following: Total body skin exam (Hx of Dysplastic Nevi, L post hip) and check spot (L neck, pt picked at).  The patient presents for Total-Body Skin Exam (TBSE) for skin cancer screening and mole check.  The patient has spots, moles and lesions to be evaluated, some may be new or changing and the patient has concerns that these could be cancer.   The following portions of the chart were reviewed this encounter and updated as appropriate:       Review of Systems:  No other skin or systemic complaints except as noted in HPI or Assessment and Plan.  Objective  Well appearing patient in no apparent distress; mood and affect are within normal limits.  A full examination was performed including scalp, head, eyes, ears, nose, lips, neck, chest, axillae, abdomen, back, buttocks, bilateral upper extremities, bilateral lower extremities, hands, feet, fingers, toes, fingernails, and toenails. All findings within normal limits unless otherwise noted below.  L post thigh; Left Breast; Right lat breast; R upper abdomen; Right Inframammary; L scapula  Right Inframammary 5.66m brown macule  R upper abdomen 2.03mmed dark brown macule  Right lat breast 3.50m78medium dark brown macule  Left Breast 8.0 x 4.50mm35mnk brown pap with 2mm 53mker center  L scapula 2.5mm m8mum dark brown macule  L post thigh 3.50mm me31mrown macule  L neck x 1, R ant thigh x 1 (2) Stuck on waxy paps with erythema  L lower leg  L lower leg 9.0 x 7.50mm spe15med light brown macule    Assessment & Plan  Nevus (6) L post thigh; Left Breast; Right lat breast; R upper abdomen; Right Inframammary; L scapula  Benign-appearing. Stable compared to previous visit. Observation.  Call clinic for new or changing moles.  Recommend daily use of broad spectrum spf 30+ sunscreen to sun-exposed areas.    Inflamed seborrheic keratosis  (2) L neck x 1, R ant thigh x 1  Symptomatic, irritating, patient would like treated.   Destruction of lesion - L neck x 1, R ant thigh x 1  Destruction method: cryotherapy   Informed consent: discussed and consent obtained   Lesion destroyed using liquid nitrogen: Yes   Region frozen until ice ball extended beyond lesion: Yes   Outcome: patient tolerated procedure well with no complications   Post-procedure details: wound care instructions given   Additional details:  Prior to procedure, discussed risks of blister formation, small wound, skin dyspigmentation, or rare scar following cryotherapy. Recommend Vaseline ointment to treated areas while healing.   Lentigo L lower leg  Benign-appearing. Stable compared to previous visit. Observation.  Call clinic for new or changing moles.  Recommend daily use of broad spectrum spf 30+ sunscreen to sun-exposed areas.     Lentigines - Scattered tan macules - Due to sun exposure - Benign-appearing, observe - Recommend daily broad spectrum sunscreen SPF 30+ to sun-exposed areas, reapply every 2 hours as needed. - Call for any changes - back, abdomen  Seborrheic Keratoses - Stuck-on, waxy, tan-brown papules and/or plaques  - Benign-appearing - Discussed benign etiology and prognosis. - Observe - Call for any changes  Melanocytic Nevi - Tan-brown and/or pink-flesh-colored symmetric macules and papules - Benign appearing on exam today - Observation - Call clinic for new or changing moles - Recommend daily use of broad spectrum spf 30+ sunscreen to sun-exposed areas.  - back, neck, face  Hemangiomas - Red papules - Discussed benign nature - Observe - Call for any changes - back, abdomen  Actinic Damage - Chronic condition, secondary to cumulative UV/sun exposure - diffuse scaly erythematous macules with underlying dyspigmentation - Recommend daily broad spectrum sunscreen SPF 30+ to sun-exposed areas, reapply every 2 hours as  needed.  - Staying in the shade or wearing long sleeves, sun glasses (UVA+UVB protection) and wide brim hats (4-inch brim around the entire circumference of the hat) are also recommended for sun protection.  - Call for new or changing lesions.  Skin cancer screening performed today.   History of Dysplastic Nevi - No evidence of recurrence today - Recommend regular full body skin exams - Recommend daily broad spectrum sunscreen SPF 30+ to sun-exposed areas, reapply every 2 hours as needed.  - Call if any new or changing lesions are noted between office visits  - L post hip  Return in about 1 year (around 11/09/2023) for TBSE, Hx of Dysplastic nevi.  I, Othelia Pulling, RMA, am acting as scribe for Brendolyn Patty, MD .  Documentation: I have reviewed the above documentation for accuracy and completeness, and I agree with the above.  Brendolyn Patty MD

## 2023-05-19 ENCOUNTER — Ambulatory Visit
Admission: RE | Admit: 2023-05-19 | Discharge: 2023-05-19 | Disposition: A | Discharge status: Home / Self Care | Payer: Medicare HMO | Source: Ambulatory Visit | Attending: Nurse Practitioner | Admitting: Nurse Practitioner

## 2023-05-19 DIAGNOSIS — Z1231 Encounter for screening mammogram for malignant neoplasm of breast: Secondary | ICD-10-CM | POA: Insufficient documentation

## 2023-06-06 ENCOUNTER — Encounter: Payer: Medicare HMO | Admitting: Advanced Practice Midwife

## 2023-06-07 NOTE — Progress Notes (Signed)
This encounter was created in error - please disregard.

## 2023-10-10 ENCOUNTER — Ambulatory Visit: Payer: PPO | Admitting: Dermatology

## 2023-10-10 DIAGNOSIS — L309 Dermatitis, unspecified: Secondary | ICD-10-CM

## 2023-10-10 DIAGNOSIS — L249 Irritant contact dermatitis, unspecified cause: Secondary | ICD-10-CM

## 2023-10-10 DIAGNOSIS — L821 Other seborrheic keratosis: Secondary | ICD-10-CM

## 2023-10-10 MED ORDER — CLOBETASOL PROPIONATE 0.05 % EX CREA: TOPICAL_CREAM | CUTANEOUS | 1 refills | Status: DC

## 2023-10-10 NOTE — Progress Notes (Signed)
   Follow-Up Visit   Subjective  Amy Saunders is a 68 y.o. female who presents for the following: breaking out on hands, started 1 week ago, itchy. She washes hands a lot during the day and uses lotions. She gets cracks on her fingers, too, and uses Neosporin. No history of eczema. The patient has spots, moles and lesions to be evaluated, some may be new or changing.   The following portions of the chart were reviewed this encounter and updated as appropriate: medications, allergies, medical history  Review of Systems:  No other skin or systemic complaints except as noted in HPI or Assessment and Plan.  Objective  Well appearing patient in no apparent distress; mood and affect are within normal limits.    A focused examination was performed of the following areas: hands  Relevant exam findings are noted in the Assessment and Plan.    Assessment & Plan   SEBORRHEIC KERATOSIS - Stuck-on, waxy, tan-brown papules - Benign-appearing - Discussed benign etiology and prognosis. - Observe - Call for any changes  Hand Dermatitis, likely irritant contact Exam: Fissuring on the fingertips, thumb tips; few small bright pink scaly patches on the bilateral hand dorsum, extensor wrists.   Hand Dermatitis is a chronic type of eczema that can come and go on the hands and fingers.  While there is no cure, the rash and symptoms can be managed with topical prescription medications, and for more severe cases, with systemic medications.  Recommend mild soap and routine use of moisturizing cream after handwashing.  Minimize soap/water exposure when possible.     Treatment Plan:  Start Clobetasol cream Apply to AA hands, fingers BID dsp 30 g 1Rf.  Avoid applying to face, groin, and axilla. Use as directed. Long-term use can cause thinning of the skin.  Recommend mild soap (Dove) and moisturizing cream 1-2 times daily.  Gentle skin care handout provided. Moisturizers given.   Recommend gloves  while washing dishes.  DERMATITIS   Related Medications clobetasol cream (TEMOVATE) 0.05 % Spot treat affected areas hands and fingers once to twice a day until rash improved. Avoid applying to face, groin, and axilla. Use as directed. Long-term use can cause thinning of the skin. Return as scheduled, for TBSE.  ICherlyn Labella, CMA, am acting as scribe for Willeen Niece, MD .   Documentation: I have reviewed the above documentation for accuracy and completeness, and I agree with the above.  Willeen Niece, MD

## 2023-10-10 NOTE — Patient Instructions (Addendum)
Gentle Skin Care Guide  1. Bathe no more than once a day.  2. Avoid bathing in hot water  3. Use a mild soap like Dove, Vanicream, Cetaphil, CeraVe. Can use Lever 2000 or Cetaphil antibacterial soap  4. Use soap only where you need it. On most days, use it under your arms, between your legs, and on your feet. Let the water rinse other areas unless visibly dirty.  5. When you get out of the bath/shower, use a towel to gently blot your skin dry, don't rub it.  6. While your skin is still a little damp, apply a moisturizing cream such as Vanicream, CeraVe, Cetaphil, Eucerin, Sarna lotion or plain Vaseline Jelly. For hands apply Neutrogena [place] Hand Cream or Excipial Hand Cream.  7. Reapply moisturizer any time you start to itch or feel dry.  8. Sometimes using free and clear laundry detergents can be helpful. Fabric softener sheets should be avoided. Downy Free & Gentle liquid, or any liquid fabric softener that is free of dyes and perfumes, it acceptable to use  9. If your doctor has given you prescription creams you may apply moisturizers over them    Due to recent changes in healthcare laws, you may see results of your pathology and/or laboratory studies on MyChart before the doctors have had a chance to review them. We understand that in some cases there may be results that are confusing or concerning to you. Please understand that not all results are received at the same time and often the doctors may need to interpret multiple results in order to provide you with the best plan of care or course of treatment. Therefore, we ask that you please give [place] 2 business days to thoroughly review all your results before contacting the office for clarification. Should we see a critical lab result, you will be contacted sooner.   If You Need Anything After Your Visit  If you have any questions or concerns for your doctor, please call our main line at [phone] and press option 4 to reach your  doctor's medical assistant. If no one answers, please leave a voicemail as directed and we will return your call as soon as possible. Messages left after 4 pm will be answered the following business day.   You may also send [place] a message via MyChart. We typically respond to MyChart messages within 1-2 business days.  For prescription refills, please ask your pharmacy to contact our office. Our fax number is [phone].  If you have an urgent issue when the clinic is closed that cannot wait until the next business day, you can page your doctor at the number below.    Please note that while we do our best to be available for urgent issues outside of office hours, we are not available 24/7.   If you have an urgent issue and are unable to reach [place], you may choose to seek medical care at your doctor's office, retail clinic, urgent care center, or emergency room.  If you have a medical emergency, please immediately call 911 or go to the emergency department.  Pager Numbers  - Dr. Gwen Pounds: [phone]  - Dr. Roseanne Reno: [phone]  - Dr. Katrinka Blazing: [phone]   In the event of inclement weather, please call our main line at [phone] for an update on the status of any delays or closures.  Dermatology Medication Tips: Please keep the boxes that topical medications come in in order to help keep track of the instructions about where  and how to use these. Pharmacies typically print the medication instructions only on the boxes and not directly on the medication tubes.   If your medication is too expensive, please contact our office at [phone] option 4 or send [place] a message through MyChart.   We are unable to tell what your co-pay for medications will be in advance as this is different depending on your insurance coverage. However, we may be able to find a substitute medication at lower cost or fill out paperwork to get insurance to cover a needed medication.   If a prior authorization is  required to get your medication covered by your insurance company, please allow [place] 1-2 business days to complete this process.  Drug prices often vary depending on where the prescription is filled and some pharmacies may offer cheaper prices.  The website www.goodrx.com contains coupons for medications through different pharmacies. The prices here do not account for what the cost may be with help from insurance (it may be cheaper with your insurance), but the website can give you the price if you did not use any insurance.  - You can print the associated coupon and take it with your prescription to the pharmacy.  - You may also stop by our office during regular business hours and pick up a GoodRx coupon card.  - If you need your prescription sent electronically to a different pharmacy, notify our office through [facility] or by phone at [phone] option 4.     Si Usted Necesita Algo Despus de Su Visita  Tambin puede enviarnos un mensaje a travs de Clinical cytogeneticist. Por lo general respondemos a los mensajes de MyChart en el transcurso de 1 a 2 das hbiles.  Para renovar recetas, por favor pida a su farmacia que se ponga en contacto con nuestra oficina. Annie Sable de fax es [place] [phone].  Si tiene un asunto urgente cuando la clnica est cerrada y que no puede esperar hasta el siguiente da hbil, puede llamar/localizar a su doctor(a) al nmero que aparece a continuacin.   Por favor, tenga en cuenta que aunque hacemos todo lo posible para estar disponibles para asuntos urgentes fuera del horario de [place], no estamos disponibles las 24 horas del da, los 7 [address]  [address] de la [place].   Si tiene un problema urgente y no puede comunicarse con nosotros, puede optar por buscar atencin mdica  en el consultorio de su doctor(a), en una clnica privada, en un centro de atencin urgente o en una sala de emergencias.  Si tiene Engineer, drilling, por favor llame inmediatamente al 911 o vaya a  la sala de emergencias.  Nmeros de bper  - Dr. Gwen Pounds: [phone]  - Dra. Roseanne Reno: [phone]  - Dr. Katrinka Blazing: [phone]   En caso de inclemencias del tiempo, por favor llame a Lacy Duverney principal al [phone] para una actualizacin sobre el [place] de cualquier retraso o cierre.  Consejos para la medicacin en dermatologa: Por favor, guarde las cajas en las que vienen los medicamentos de uso tpico para ayudarle a seguir las instrucciones sobre dnde y cmo usarlos. Las farmacias generalmente imprimen las instrucciones del medicamento slo en las cajas y no directamente en los tubos del [place].   Si su medicamento es muy caro, por favor, pngase en contacto con Rolm Gala llamando al [phone] y presione la opcin 4 o envenos un mensaje a travs de Clinical cytogeneticist.   No podemos decirle cul ser su copago por los medicamentos por adelantado ya que esto  es diferente dependiendo de la cobertura de su seguro. Sin embargo, es posible que podamos encontrar un medicamento sustituto a Audiological scientist un formulario para que el seguro cubra el medicamento que se considera necesario.   Si se requiere una autorizacin previa para que su compaa de seguros [place] su medicamento, por favor permtanos de 1 a 2 das hbiles para completar [address]  Los precios de los medicamentos varan con frecuencia dependiendo del Environmental consultant de dnde se surte la receta y alguna farmacias pueden ofrecer precios ms baratos.  El sitio web www.goodrx.com tiene cupones para medicamentos de Health and safety inspector. Los precios aqu no tienen en cuenta lo que podra costar con la ayuda del seguro (puede ser ms barato con su seguro), pero el sitio web puede darle el precio si no utiliz Tourist information centre manager.  - Puede imprimir el cupn correspondiente y llevarlo con su receta a la farmacia.  - Tambin puede pasar por nuestra oficina durante el horario de atencin regular y Education officer, museum una tarjeta de cupones de  GoodRx.  - Si necesita que su receta se enve electrnicamente a una farmacia diferente, informe a nuestra oficina a travs de MyChart de [place] o por telfono llamando al [phone] y presione la opcin 4.

## 2023-11-04 DIAGNOSIS — Z79899 Other long term (current) drug therapy: Secondary | ICD-10-CM | POA: Diagnosis not present

## 2023-11-04 DIAGNOSIS — E559 Vitamin D deficiency, unspecified: Secondary | ICD-10-CM | POA: Diagnosis not present

## 2023-11-04 DIAGNOSIS — Z636 Dependent relative needing care at home: Secondary | ICD-10-CM | POA: Diagnosis not present

## 2023-11-04 DIAGNOSIS — M810 Age-related osteoporosis without current pathological fracture: Secondary | ICD-10-CM | POA: Diagnosis not present

## 2023-11-04 DIAGNOSIS — Z0000 Encounter for general adult medical examination without abnormal findings: Secondary | ICD-10-CM | POA: Diagnosis not present

## 2023-11-04 DIAGNOSIS — N952 Postmenopausal atrophic vaginitis: Secondary | ICD-10-CM | POA: Diagnosis not present

## 2023-11-04 DIAGNOSIS — R7302 Impaired glucose tolerance (oral): Secondary | ICD-10-CM | POA: Diagnosis not present

## 2023-11-15 ENCOUNTER — Encounter: Payer: Self-pay | Admitting: Dermatology

## 2023-11-15 ENCOUNTER — Ambulatory Visit (INDEPENDENT_AMBULATORY_CARE_PROVIDER_SITE_OTHER): Payer: Medicare HMO | Admitting: Dermatology

## 2023-11-15 DIAGNOSIS — L814 Other melanin hyperpigmentation: Secondary | ICD-10-CM

## 2023-11-15 DIAGNOSIS — I8393 Asymptomatic varicose veins of bilateral lower extremities: Secondary | ICD-10-CM | POA: Diagnosis not present

## 2023-11-15 DIAGNOSIS — L309 Dermatitis, unspecified: Secondary | ICD-10-CM

## 2023-11-15 DIAGNOSIS — D229 Melanocytic nevi, unspecified: Secondary | ICD-10-CM | POA: Diagnosis not present

## 2023-11-15 DIAGNOSIS — L820 Inflamed seborrheic keratosis: Secondary | ICD-10-CM | POA: Diagnosis not present

## 2023-11-15 DIAGNOSIS — L578 Other skin changes due to chronic exposure to nonionizing radiation: Secondary | ICD-10-CM

## 2023-11-15 DIAGNOSIS — D225 Melanocytic nevi of trunk: Secondary | ICD-10-CM

## 2023-11-15 DIAGNOSIS — W908XXA Exposure to other nonionizing radiation, initial encounter: Secondary | ICD-10-CM

## 2023-11-15 DIAGNOSIS — I781 Nevus, non-neoplastic: Secondary | ICD-10-CM

## 2023-11-15 DIAGNOSIS — Z1283 Encounter for screening for malignant neoplasm of skin: Secondary | ICD-10-CM | POA: Diagnosis not present

## 2023-11-15 DIAGNOSIS — D2272 Melanocytic nevi of left lower limb, including hip: Secondary | ICD-10-CM | POA: Diagnosis not present

## 2023-11-15 DIAGNOSIS — D1801 Hemangioma of skin and subcutaneous tissue: Secondary | ICD-10-CM

## 2023-11-15 DIAGNOSIS — L821 Other seborrheic keratosis: Secondary | ICD-10-CM

## 2023-11-15 DIAGNOSIS — L249 Irritant contact dermatitis, unspecified cause: Secondary | ICD-10-CM

## 2023-11-15 DIAGNOSIS — Z86018 Personal history of other benign neoplasm: Secondary | ICD-10-CM

## 2023-11-15 NOTE — Patient Instructions (Addendum)
 Use Clobetasol cream Apply to hands, fingers twice a day as needed for flares Avoid applying to face, groin, and axilla. Use as directed. Long-term use can cause thinning of the skin.  Topical steroids (such as triamcinolone, fluocinolone, fluocinonide, mometasone, clobetasol, halobetasol, betamethasone, hydrocortisone) can cause thinning and lightening of the skin if they are used for too long in the same area. Your physician has selected the right strength medicine for your problem and area affected on the body. Please use your medication only as directed by your physician to prevent side effects.     Recommend daily broad spectrum sunscreen SPF 30+ to sun-exposed areas, reapply every 2 hours as needed. Call for new or changing lesions.  Staying in the shade or wearing long sleeves, sun glasses (UVA+UVB protection) and wide brim hats (4-inch brim around the entire circumference of the hat) are also recommended for sun protection.     Melanoma ABCDEs  Melanoma is the most dangerous type of skin cancer, and is the leading cause of death from skin disease.  You are more likely to develop melanoma if you: Have light-colored skin, light-colored eyes, or red or blond hair Spend a lot of time in the sun Tan regularly, either outdoors or in a tanning bed Have had blistering sunburns, especially during childhood Have a close family member who has had a melanoma Have atypical moles or large birthmarks  Early detection of melanoma is key since treatment is typically straightforward and cure rates are extremely high if we catch it early.   The first sign of melanoma is often a change in a mole or a new dark spot.  The ABCDE system is a way of remembering the signs of melanoma.  A for asymmetry:  The two halves do not match. B for border:  The edges of the growth are irregular. C for color:  A mixture of colors are present instead of an even brown color. D for diameter:  Melanomas are usually (but  not always) greater than 6mm - the size of a pencil eraser. E for evolution:  The spot keeps changing in size, shape, and color.  Please check your skin once per month between visits. You can use a small mirror in front and a large mirror behind you to keep an eye on the back side or your body.   If you see any new or changing lesions before your next follow-up, please call to schedule a visit.  Please continue daily skin protection including broad spectrum sunscreen SPF 30+ to sun-exposed areas, reapplying every 2 hours as needed when you're outdoors.   Staying in the shade or wearing long sleeves, sun glasses (UVA+UVB protection) and wide brim hats (4-inch brim around the entire circumference of the hat) are also recommended for sun protection.      Due to recent changes in healthcare laws, you may see results of your pathology and/or laboratory studies on MyChart before the doctors have had a chance to review them. We understand that in some cases there may be results that are confusing or concerning to you. Please understand that not all results are received at the same time and often the doctors may need to interpret multiple results in order to provide you with the best plan of care or course of treatment. Therefore, we ask that you please give [place] 2 business days to thoroughly review all your results before contacting the office for clarification. Should we see a critical lab result, you will be contacted  sooner.   If You Need Anything After Your Visit  If you have any questions or concerns for your doctor, please call our main line at [phone] and press option 4 to reach your doctor's medical assistant. If no one answers, please leave a voicemail as directed and we will return your call as soon as possible. Messages left after 4 pm will be answered the following business day.   You may also send [place] a message via MyChart. We typically respond to MyChart messages within 1-2 business  days.  For prescription refills, please ask your pharmacy to contact our office. Our fax number is [phone].  If you have an urgent issue when the clinic is closed that cannot wait until the next business day, you can page your doctor at the number below.    Please note that while we do our best to be available for urgent issues outside of office hours, we are not available 24/7.   If you have an urgent issue and are unable to reach [place], you may choose to seek medical care at your doctor's office, retail clinic, urgent care center, or emergency room.  If you have a medical emergency, please immediately call 911 or go to the emergency department.  Pager Numbers  - Dr. Gwen Pounds: [phone]  - Dr. Roseanne Reno: [phone]  - Dr. Katrinka Blazing: [phone]   In the event of inclement weather, please call our main line at [phone] for an update on the status of any delays or closures.  Dermatology Medication Tips: Please keep the boxes that topical medications come in in order to help keep track of the instructions about where and how to use these. Pharmacies typically print the medication instructions only on the boxes and not directly on the medication tubes.   If your medication is too expensive, please contact our office at [phone] option 4 or send [place] a message through MyChart.   We are unable to tell what your co-pay for medications will be in advance as this is different depending on your insurance coverage. However, we may be able to find a substitute medication at lower cost or fill out paperwork to get insurance to cover a needed medication.   If a prior authorization is required to get your medication covered by your insurance company, please allow [place] 1-2 business days to complete this process.  Drug prices often vary depending on where the prescription is filled and some pharmacies may offer cheaper prices.  The website www.goodrx.com contains coupons for medications  through different pharmacies. The prices here do not account for what the cost may be with help from insurance (it may be cheaper with your insurance), but the website can give you the price if you did not use any insurance.  - You can print the associated coupon and take it with your prescription to the pharmacy.  - You may also stop by our office during regular business hours and pick up a GoodRx coupon card.  - If you need your prescription sent electronically to a different pharmacy, notify our office through [facility] or by phone at [phone] option 4.     Si Usted Necesita Algo Despus de Su Visita  Tambin puede enviarnos un mensaje a travs de Clinical cytogeneticist. Por lo general respondemos a los mensajes de MyChart en el transcurso de 1 a 2 das hbiles.  Para renovar recetas, por favor pida a su farmacia que se ponga en contacto con nuestra oficina. Annie Sable de fax es  el [phone].  Si tiene un asunto urgente cuando la clnica est cerrada y que no puede esperar hasta el siguiente da hbil, puede llamar/localizar a su doctor(a) al nmero que aparece a continuacin.   Por favor, tenga en cuenta que aunque hacemos todo lo posible para estar disponibles para asuntos urgentes fuera del horario de [place], no estamos disponibles las 24 horas del da, los 7 [address]  [address] de la [place].   Si tiene un problema urgente y no puede comunicarse con nosotros, puede optar por buscar atencin mdica  en el consultorio de su doctor(a), en una clnica privada, en un centro de atencin urgente o en una sala de emergencias.  Si tiene Engineer, drilling, por favor llame inmediatamente al 911 o vaya a la sala de emergencias.  Nmeros de bper  - Dr. Gwen Pounds: [phone]  - Dra. Roseanne Reno: [phone]  - Dr. Katrinka Blazing: [phone]   En caso de inclemencias del tiempo, por favor llame a Lacy Duverney principal al [phone] para una actualizacin sobre el [place] de cualquier retraso o  cierre.  Consejos para la medicacin en dermatologa: Por favor, guarde las cajas en las que vienen los medicamentos de uso tpico para ayudarle a seguir las instrucciones sobre dnde y cmo usarlos. Las farmacias generalmente imprimen las instrucciones del medicamento slo en las cajas y no directamente en los tubos del [place].   Si su medicamento es muy caro, por favor, pngase en contacto con Rolm Gala llamando al [phone] y presione la opcin 4 o envenos un mensaje a travs de Clinical cytogeneticist.   No podemos decirle cul ser su copago por los medicamentos por adelantado ya que esto es diferente dependiendo de la cobertura de su seguro. Sin embargo, es posible que podamos encontrar un medicamento sustituto a Audiological scientist un formulario para que el seguro cubra el medicamento que se considera necesario.   Si se requiere una autorizacin previa para que su compaa de seguros [place] su medicamento, por favor permtanos de 1 a 2 das hbiles para completar [address]  Los precios de los medicamentos varan con frecuencia dependiendo del Environmental consultant de dnde se surte la receta y alguna farmacias pueden ofrecer precios ms baratos.  El sitio web www.goodrx.com tiene cupones para medicamentos de Health and safety inspector. Los precios aqu no tienen en cuenta lo que podra costar con la ayuda del seguro (puede ser ms barato con su seguro), pero el sitio web puede darle el precio si no utiliz Tourist information centre manager.  - Puede imprimir el cupn correspondiente y llevarlo con su receta a la farmacia.  - Tambin puede pasar por nuestra oficina durante el horario de atencin regular y Education officer, museum una tarjeta de cupones de GoodRx.  - Si necesita que su receta se enve electrnicamente a una farmacia diferente, informe a nuestra oficina a travs de MyChart de [place] o por telfono llamando al [phone] y presione la opcin 4.

## 2023-11-15 NOTE — Progress Notes (Signed)
 Follow-Up Visit   Subjective  Amy Saunders is a 68 y.o. female who presents for the following: Skin Cancer Screening and Full Body Skin Exam. Hx of dysplastic nevi. No personal hx of skin cancer.   The patient presents for Total-Body Skin Exam (TBSE) for skin cancer screening and mole check. The patient has spots, moles and lesions to be evaluated, some may be new or changing and the patient may have concern these could be cancer.  Had itchy spot on back but it is now healing up.    The following portions of the chart were reviewed this encounter and updated as appropriate: medications, allergies, medical history  Review of Systems:  No other skin or systemic complaints except as noted in HPI or Assessment and Plan.  Objective  Well appearing patient in no apparent distress; mood and affect are within normal limits.  A full examination was performed including scalp, head, eyes, ears, nose, lips, neck, chest, axillae, abdomen, back, buttocks, bilateral upper extremities, bilateral lower extremities, hands, feet, fingers, toes, fingernails, and toenails. All findings within normal limits unless otherwise noted below.   Relevant physical exam findings are noted in the Assessment and Plan.  Left Spinal Upper Back Erythematous keratotic or waxy stuck-on papule  Exam of nails limited by presence of nail polish.       Assessment & Plan   SKIN CANCER SCREENING PERFORMED TODAY.  HISTORY OF DYSPLASTIC NEVI. Right upper abdomen, severe, excised 02/27/2018. Left posterior hip, severe, excised 03/06/2018. No evidence of recurrence today Recommend regular full body skin exams Recommend daily broad spectrum sunscreen SPF 30+ to sun-exposed areas, reapply every 2 hours as needed.  Call if any new or changing lesions are noted between office visits   ACTINIC DAMAGE - Chronic condition, secondary to cumulative UV/sun exposure - diffuse scaly erythematous macules with underlying  dyspigmentation - Recommend daily broad spectrum sunscreen SPF 30+ to sun-exposed areas, reapply every 2 hours as needed.  - Staying in the shade or wearing long sleeves, sun glasses (UVA+UVB protection) and wide brim hats (4-inch brim around the entire circumference of the hat) are also recommended for sun protection.  - Call for new or changing lesions.  LENTIGINES, SEBORRHEIC KERATOSES, HEMANGIOMAS - Benign normal skin lesions. Torso, neck - Benign-appearing - Call for any changes  MELANOCYTIC NEVI - Tan-brown and/or pink-flesh-colored symmetric macules and papules - 5.0 mm x 3 mm brown macule at right inframammary  - 2.15mm med dark brown macule at right upper abdomen  - 3.0 mm x 4 mm medium dark brown macule at right lateral breast - 8.0 x 4.77mm pink brown papule with 2mm darker center at left breast  - 2.5 mm medium dark brown thin papule at left scapula - 3.0 mm med brown macule at left posterior thigh - 1 mm brown macule at left plantar foot. - Benign-appearing. Stable compared to previous visit. Observation.  Call clinic for new or changing moles.  Recommend daily use of broad spectrum spf 30+ sunscreen to sun-exposed areas.    LENTIGO Vs FLAT SK Exam: 10 x 7.48mm speckled light brown macule at left lower leg, Photo taken today  Due to sun exposure Treatment Plan: Benign-appearing, stable, observe. Recommend daily broad spectrum sunscreen SPF 30+ to sun-exposed areas, reapply every 2 hours as needed.  Call for any changes   Varicose Veins/Spider Veins - Dilated blue, purple or red veins at the lower extremities - Reassured - Smaller vessels can be treated by sclerotherapy (a procedure to  inject a medicine into the veins to make them disappear) if desired, but the treatment is not covered by insurance. Larger vessels may be covered if symptomatic and we would refer to vascular surgeon if treatment desired.  HAND DERMATITIS Exam Hands mostly clear today, small fissure at right  thumb  Chronic condition with duration or expected duration over one year. Currently well-controlled.  Hand Dermatitis is a chronic type of eczema that can come and go on the hands and fingers.  While there is no cure, the rash and symptoms can be managed with topical prescription medications, and for more severe cases, with systemic medications.  Recommend mild soap and routine use of moisturizing cream after handwashing.  Minimize soap/water exposure when possible.    Treatment Plan Use Clobetasol cream Apply to AA hands, fingers BID as needed for flares Avoid applying to face, groin, and axilla. Use as directed. Long-term use can cause thinning of the skin.  Topical steroids (such as triamcinolone, fluocinolone, fluocinonide, mometasone, clobetasol, halobetasol, betamethasone, hydrocortisone) can cause thinning and lightening of the skin if they are used for too long in the same area. Your physician has selected the right strength medicine for your problem and area affected on the body. Please use your medication only as directed by your physician to prevent side effects.   Recommend mild soap and moisturizing cream with handwashing.   INFLAMED SEBORRHEIC KERATOSIS Left Spinal Upper Back Reassured benign age-related growth.  Recommend observation.  Discussed cryotherapy if spot(s) become irritated or inflamed. Patient deferred treatment at this time.   Return in about 1 year (around 11/14/2024) for TBSE, Hx of dysplastic nevi.  I, Lawson Radar, CMA, am acting as scribe for Willeen Niece, MD.   Documentation: I have reviewed the above documentation for accuracy and completeness, and I agree with the above.  Willeen Niece, MD

## 2024-04-02 ENCOUNTER — Other Ambulatory Visit: Payer: Self-pay

## 2024-04-02 DIAGNOSIS — L309 Dermatitis, unspecified: Secondary | ICD-10-CM

## 2024-04-02 MED ORDER — CLOBETASOL PROPIONATE 0.05 % EX CREA: TOPICAL_CREAM | CUTANEOUS | 1 refills | Status: AC

## 2024-04-02 NOTE — Telephone Encounter (Signed)
 Patient called requesting a refill of Clobetasol  cream  Okay Clobetasol  cream 30 gm 1 RF sent to [facility]

## 2024-04-19 ENCOUNTER — Other Ambulatory Visit: Payer: Self-pay | Admitting: Nurse Practitioner

## 2024-04-19 DIAGNOSIS — Z1231 Encounter for screening mammogram for malignant neoplasm of breast: Secondary | ICD-10-CM

## 2024-05-15 DIAGNOSIS — M810 Age-related osteoporosis without current pathological fracture: Secondary | ICD-10-CM | POA: Diagnosis not present

## 2024-06-11 DIAGNOSIS — H40003 Preglaucoma, unspecified, bilateral: Secondary | ICD-10-CM | POA: Diagnosis not present

## 2024-06-11 DIAGNOSIS — H2513 Age-related nuclear cataract, bilateral: Secondary | ICD-10-CM | POA: Diagnosis not present

## 2024-06-28 ENCOUNTER — Ambulatory Visit
Admission: RE | Admit: 2024-06-28 | Discharge: 2024-06-28 | Disposition: A | Discharge status: Home / Self Care | Source: Ambulatory Visit | Attending: Nurse Practitioner | Admitting: Nurse Practitioner

## 2024-06-28 DIAGNOSIS — Z1231 Encounter for screening mammogram for malignant neoplasm of breast: Secondary | ICD-10-CM | POA: Diagnosis not present

## 2024-07-17 ENCOUNTER — Telehealth: Payer: Self-pay

## 2024-07-17 NOTE — Telephone Encounter (Signed)
 Per pt phone call received letter stating to call to schedule  procedure  for April. Pt  colonoscopy was completed by Dr. Unk. I explained that Dr. Unk was no longer here and was now at [facility]. Pt said she is doesn't matter who do the procedure.

## 2024-11-20 ENCOUNTER — Encounter: Admitting: Dermatology
